# Patient Record
Sex: Female | Born: 1995 | Race: Black or African American | Hispanic: No | Marital: Single | Smoking: Never smoker
Health system: Southern US, Community
[De-identification: ages and names within clinical notes are randomized; demographics above are authoritative.]

## PROBLEM LIST (undated history)

## (undated) DIAGNOSIS — N809 Endometriosis, unspecified: Secondary | ICD-10-CM

## (undated) DIAGNOSIS — O139 Gestational [pregnancy-induced] hypertension without significant proteinuria, unspecified trimester: Secondary | ICD-10-CM

## (undated) HISTORY — DX: Gestational (pregnancy-induced) hypertension without significant proteinuria, unspecified trimester: O13.9

## (undated) HISTORY — PX: WISDOM TOOTH EXTRACTION: SHX21

---

## 2017-05-25 ENCOUNTER — Emergency Department (HOSPITAL_COMMUNITY): Payer: Self-pay

## 2017-05-25 ENCOUNTER — Emergency Department (HOSPITAL_COMMUNITY)
Admission: EM | Admit: 2017-05-25 | Discharge: 2017-05-26 | Discharge status: Against Medical Advice | Payer: Self-pay | Attending: Emergency Medicine | Admitting: Emergency Medicine

## 2017-05-25 ENCOUNTER — Encounter (HOSPITAL_COMMUNITY): Payer: Self-pay | Admitting: Emergency Medicine

## 2017-05-25 DIAGNOSIS — R079 Chest pain, unspecified: Secondary | ICD-10-CM

## 2017-05-25 LAB — I-STAT BETA HCG BLOOD, ED (MC, WL, AP ONLY): I-stat hCG, quantitative: <5 m[IU]/mL (ref <5)

## 2017-05-25 LAB — CBC
HEMATOCRIT: 33.3 % — AB (ref 36.0–46.0)
HEMOGLOBIN: 11.3 g/dL — AB (ref 12.0–15.0)
MCH: 26.3 pg (ref 26.0–34.0)
MCHC: 33.9 g/dL (ref 30.0–36.0)
MCV: 77.4 fL — ABNORMAL LOW (ref 78.0–100.0)
Platelets: 212 10*3/uL (ref 150–400)
RBC: 4.3 MIL/uL (ref 3.87–5.11)
RDW: 13.9 % (ref 11.5–15.5)
WBC: 5.6 10*3/uL (ref 4.0–10.5)

## 2017-05-25 LAB — BASIC METABOLIC PANEL
ANION GAP: 8 (ref 5–15)
BUN: 12 mg/dL (ref 6–20)
CALCIUM: 8.9 mg/dL (ref 8.9–10.3)
CHLORIDE: 107 mmol/L (ref 101–111)
CO2: 23 mmol/L (ref 22–32)
Creatinine, Ser: 0.8 mg/dL (ref 0.44–1.00)
GFR calc non Af Amer: >60 mL/min (ref >60)
GLUCOSE: 82 mg/dL (ref 65–99)
POTASSIUM: 3.6 mmol/L (ref 3.5–5.1)
Sodium: 138 mmol/L (ref 135–145)

## 2017-05-25 LAB — I-STAT TROPONIN, ED: TROPONIN I, POC: 0 ng/mL (ref 0.00–0.08)
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
## 2017-05-26 LAB — D-DIMER, QUANTITATIVE: D-Dimer, Quant: <0.27 ug/mL-FEU (ref 0.00–0.50)

## 2017-05-26 NOTE — ED Notes (Signed)
Called main lab to add on ddimer. 

## 2017-05-26 NOTE — ED Notes (Signed)
Pt reporting she wants to leave; ambulatory with steady gait to lobby with friend

## 2017-05-26 NOTE — ED Notes (Signed)
This RN saw pt leaving, EDP aware.
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
## 2019-05-24 LAB — OB RESULTS CONSOLE HIV ANTIBODY (ROUTINE TESTING): HIV: NONREACTIVE

## 2019-05-24 LAB — OB RESULTS CONSOLE HEPATITIS B SURFACE ANTIGEN: Hepatitis B Surface Ag: NEGATIVE

## 2019-05-24 LAB — OB RESULTS CONSOLE RUBELLA ANTIBODY, IGM: Rubella: IMMUNE

## 2019-06-17 LAB — OB RESULTS CONSOLE GC/CHLAMYDIA
Chlamydia: NEGATIVE
Gonorrhea: NEGATIVE

## 2019-10-03 LAB — OB RESULTS CONSOLE HIV ANTIBODY (ROUTINE TESTING): HIV: NONREACTIVE

## 2019-12-23 ENCOUNTER — Telehealth (HOSPITAL_COMMUNITY): Payer: Self-pay | Admitting: *Deleted

## 2019-12-23 ENCOUNTER — Encounter (HOSPITAL_COMMUNITY): Payer: Self-pay | Admitting: *Deleted

## 2019-12-23 NOTE — Telephone Encounter (Signed)
Preadmission screen  

## 2019-12-24 ENCOUNTER — Other Ambulatory Visit (HOSPITAL_COMMUNITY)
Admission: RE | Admit: 2019-12-24 | Discharge: 2019-12-24 | Disposition: A | Discharge status: Home / Self Care | Payer: BC Managed Care – PPO | Source: Ambulatory Visit | Attending: Obstetrics and Gynecology | Admitting: Obstetrics and Gynecology

## 2019-12-24 DIAGNOSIS — Z20822 Contact with and (suspected) exposure to covid-19: Secondary | ICD-10-CM | POA: Insufficient documentation

## 2019-12-24 DIAGNOSIS — Z01812 Encounter for preprocedural laboratory examination: Secondary | ICD-10-CM | POA: Insufficient documentation

## 2019-12-24 LAB — SARS CORONAVIRUS 2 (TAT 6-24 HRS): SARS Coronavirus 2: NEGATIVE

## 2019-12-24 NOTE — H&P (Signed)
Ruth Mann is a 24 y.o. female presenting for IOL s/s gHTN w/out severe features. BP elevated in the office. Labs drawn and pending.  Hx depression and anxiety not requiring meds. 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   OB History    Gravida  1   Para      Term      Preterm      AB      Living        SAB      TAB      Ectopic      Multiple      Live Births             Past Medical History:  Diagnosis Date  . Pregnancy induced hypertension    Past Surgical History:  Procedure Laterality Date  . WISDOM TOOTH EXTRACTION     Family History: family history is not on file. Social History:  reports that she has never smoked. She has never used smokeless tobacco. She reports current alcohol use. She reports that she does not use drugs.     Maternal Diabetes: No Genetic Screening: Normal Maternal Ultrasounds/Referrals: Normal Fetal Ultrasounds or other Referrals:  None Maternal Substance Abuse:  No Significant Maternal Medications:  None Significant Maternal Lab Results:  None Other Comments:  None  Review of Systems History   There were no vitals taken for this visit. Exam Physical Exam  (from office) NAD, A&O NWOB Abd soft, nondistended, gravid SVE closed/long/thick  Prenatal labs: ABO, Rh:  O+/Ab neg Antibody:   Rubella:   RPR:    HBsAg:    HIV:    GBS:   unknown  Assessment/Plan: 24 yo G1P0 @ 39.6 wga presenting for IOL s/s gHTN w/out severe features. Plan labs on admission. Cervix unfavorable. Plan for cytotec followed by pitocin/AROM when more favorable.  GBS unknown - had 4 week gap in [facility] before seeing [place] and swab not resulted yet. Will treat empirically as positive.     Ranae Pila 12/24/2019, 12:56 PM

## 2019-12-25 ENCOUNTER — Inpatient Hospital Stay (HOSPITAL_COMMUNITY): Payer: BC Managed Care – PPO | Admitting: Anesthesiology

## 2019-12-25 ENCOUNTER — Other Ambulatory Visit: Payer: Self-pay

## 2019-12-25 ENCOUNTER — Encounter (HOSPITAL_COMMUNITY): Payer: Self-pay | Admitting: Obstetrics and Gynecology

## 2019-12-25 ENCOUNTER — Inpatient Hospital Stay (HOSPITAL_COMMUNITY): Payer: BC Managed Care – PPO

## 2019-12-25 ENCOUNTER — Inpatient Hospital Stay (HOSPITAL_COMMUNITY)
Admission: AD | Admit: 2019-12-25 | Discharge: 2019-12-27 | DRG: 788 | Disposition: A | Discharge status: Home / Self Care | Payer: BC Managed Care – PPO | Attending: Obstetrics and Gynecology | Admitting: Obstetrics and Gynecology

## 2019-12-25 ENCOUNTER — Encounter (HOSPITAL_COMMUNITY)
Admission: AD | Disposition: A | Discharge status: Home / Self Care | Payer: Self-pay | Source: Home / Self Care | Attending: Obstetrics and Gynecology

## 2019-12-25 DIAGNOSIS — O9902 Anemia complicating childbirth: Secondary | ICD-10-CM | POA: Diagnosis present

## 2019-12-25 DIAGNOSIS — D649 Anemia, unspecified: Secondary | ICD-10-CM | POA: Diagnosis present

## 2019-12-25 DIAGNOSIS — Z20822 Contact with and (suspected) exposure to covid-19: Secondary | ICD-10-CM | POA: Diagnosis present

## 2019-12-25 DIAGNOSIS — Z3A39 39 weeks gestation of pregnancy: Secondary | ICD-10-CM

## 2019-12-25 DIAGNOSIS — O134 Gestational [pregnancy-induced] hypertension without significant proteinuria, complicating childbirth: Secondary | ICD-10-CM | POA: Diagnosis present

## 2019-12-25 DIAGNOSIS — Z3490 Encounter for supervision of normal pregnancy, unspecified, unspecified trimester: Secondary | ICD-10-CM

## 2019-12-25 LAB — CBC
HCT: 30.9 % — ABNORMAL LOW (ref 36.0–46.0)
HCT: 33.2 % — ABNORMAL LOW (ref 36.0–46.0)
HCT: 31.5 % — ABNORMAL LOW (ref 36.0–46.0)
Hemoglobin: 10.7 g/dL — ABNORMAL LOW (ref 12.0–15.0)
Hemoglobin: 11.5 g/dL — ABNORMAL LOW (ref 12.0–15.0)
Hemoglobin: 10.9 g/dL — ABNORMAL LOW (ref 12.0–15.0)
MCH: 28.3 pg (ref 26.0–34.0)
MCH: 27.6 pg (ref 26.0–34.0)
MCH: 28.3 pg (ref 26.0–34.0)
MCHC: 34.6 g/dL (ref 30.0–36.0)
MCHC: 34.6 g/dL (ref 30.0–36.0)
MCHC: 34.6 g/dL (ref 30.0–36.0)
MCV: 81.7 fL (ref 80.0–100.0)
MCV: 79.8 fL — ABNORMAL LOW (ref 80.0–100.0)
MCV: 81.8 fL (ref 80.0–100.0)
Platelets: 220 10*3/uL (ref 150–400)
Platelets: 240 10*3/uL (ref 150–400)
Platelets: 236 10*3/uL (ref 150–400)
RBC: 3.78 MIL/uL — ABNORMAL LOW (ref 3.87–5.11)
RBC: 4.16 MIL/uL (ref 3.87–5.11)
RBC: 3.85 MIL/uL — ABNORMAL LOW (ref 3.87–5.11)
RDW: 13.4 % (ref 11.5–15.5)
RDW: 13.3 % (ref 11.5–15.5)
RDW: 13.6 % (ref 11.5–15.5)
WBC: 10.3 10*3/uL (ref 4.0–10.5)
WBC: 9.4 10*3/uL (ref 4.0–10.5)
WBC: 16.9 10*3/uL — ABNORMAL HIGH (ref 4.0–10.5)
nRBC: 0 % (ref 0.0–0.2)
nRBC: 0 % (ref 0.0–0.2)
nRBC: 0 % (ref 0.0–0.2)

## 2019-12-25 LAB — COMPREHENSIVE METABOLIC PANEL
ALT: 13 U/L (ref 0–44)
AST: 18 U/L (ref 15–41)
Albumin: 2.6 g/dL — ABNORMAL LOW (ref 3.5–5.0)
Alkaline Phosphatase: 122 U/L (ref 38–126)
Anion gap: 9 (ref 5–15)
BUN: 5 mg/dL — ABNORMAL LOW (ref 6–20)
CO2: 23 mmol/L (ref 22–32)
Calcium: 9.5 mg/dL (ref 8.9–10.3)
Chloride: 105 mmol/L (ref 98–111)
Creatinine, Ser: 0.56 mg/dL (ref 0.44–1.00)
GFR calc Af Amer: >60 mL/min (ref >60)
GFR calc non Af Amer: >60 mL/min (ref >60)
Glucose, Bld: 91 mg/dL (ref 70–99)
Potassium: 3.7 mmol/L (ref 3.5–5.1)
Sodium: 137 mmol/L (ref 135–145)
Total Bilirubin: 0.5 mg/dL (ref 0.3–1.2)
Total Protein: 6.3 g/dL — ABNORMAL LOW (ref 6.5–8.1)

## 2019-12-25 LAB — TYPE AND SCREEN
ABO/RH(D): O POS
Antibody Screen: NEGATIVE

## 2019-12-25 LAB — RPR: RPR Ser Ql: NONREACTIVE

## 2019-12-25 LAB — ABO/RH: ABO/RH(D): O POS

## 2019-12-25 SURGERY — Surgical Case
Anesthesia: Epidural | Wound class: Clean Contaminated

## 2019-12-25 MED ORDER — SENNOSIDES-DOCUSATE SODIUM 8.6-50 MG PO TABS
2.0000 | ORAL_TABLET | ORAL | Status: DC
  Administered 2019-12-25 – 2019-12-26 (×2): 2 via ORAL
  Filled 2019-12-25 (×2): qty 2

## 2019-12-25 MED ORDER — FENTANYL CITRATE (PF) 100 MCG/2ML IJ SOLN
INTRAMUSCULAR | Status: DC | PRN
  Administered 2019-12-25: 100 ug via EPIDURAL

## 2019-12-25 MED ORDER — SOD CITRATE-CITRIC ACID 500-334 MG/5ML PO SOLN: 30.0000 mL | ORAL | Status: DC

## 2019-12-25 MED ORDER — LIDOCAINE-EPINEPHRINE (PF) 2 %-1:200000 IJ SOLN
INTRAMUSCULAR | Status: DC | PRN
  Administered 2019-12-25: 10 mL via EPIDURAL

## 2019-12-25 MED ORDER — MORPHINE SULFATE (PF) 0.5 MG/ML IJ SOLN
INTRAMUSCULAR | Status: DC | PRN
  Administered 2019-12-25: 3 mg via EPIDURAL

## 2019-12-25 MED ORDER — OXYCODONE HCL 5 MG PO TABS: 5.0000 mg | ORAL_TABLET | ORAL | Status: DC | PRN

## 2019-12-25 MED ORDER — LACTATED RINGERS IV SOLN: 500.0000 mL | Freq: Once | INTRAVENOUS | Status: DC

## 2019-12-25 MED ORDER — ZOLPIDEM TARTRATE 5 MG PO TABS: 5.0000 mg | ORAL_TABLET | Freq: Every evening | ORAL | Status: DC | PRN

## 2019-12-25 MED ORDER — SIMETHICONE 80 MG PO CHEW
80.0000 mg | CHEWABLE_TABLET | ORAL | Status: DC
  Administered 2019-12-25 – 2019-12-26 (×2): 80 mg via ORAL
  Filled 2019-12-25 (×2): qty 1

## 2019-12-25 MED ORDER — TERBUTALINE SULFATE 1 MG/ML IJ SOLN
0.2500 mg | Freq: Once | INTRAMUSCULAR | Status: AC | PRN
  Administered 2019-12-25: 0.25 mg via SUBCUTANEOUS
  Filled 2019-12-25: qty 1

## 2019-12-25 MED ORDER — DIBUCAINE (PERIANAL) 1 % EX OINT: 1.0000 "application " | TOPICAL_OINTMENT | CUTANEOUS | Status: DC | PRN

## 2019-12-25 MED ORDER — HYDROXYZINE HCL 50 MG PO TABS: 50.0000 mg | ORAL_TABLET | Freq: Four times a day (QID) | ORAL | Status: DC | PRN

## 2019-12-25 MED ORDER — SODIUM CHLORIDE 0.9 % IR SOLN
Status: DC | PRN
  Administered 2019-12-25: 1 via INTRAVESICAL

## 2019-12-25 MED ORDER — OXYCODONE-ACETAMINOPHEN 5-325 MG PO TABS: 1.0000 | ORAL_TABLET | ORAL | Status: DC | PRN

## 2019-12-25 MED ORDER — ACETAMINOPHEN 500 MG PO TABS: 1000.0000 mg | ORAL_TABLET | Freq: Four times a day (QID) | ORAL | Status: DC

## 2019-12-25 MED ORDER — OXYTOCIN 40 UNITS IN NORMAL SALINE INFUSION - SIMPLE MED: 2.5000 [IU]/h | INTRAVENOUS | Status: AC

## 2019-12-25 MED ORDER — PROMETHAZINE HCL 25 MG/ML IJ SOLN: 6.2500 mg | INTRAMUSCULAR | Status: DC | PRN

## 2019-12-25 MED ORDER — SIMETHICONE 80 MG PO CHEW: 80.0000 mg | CHEWABLE_TABLET | ORAL | Status: DC | PRN

## 2019-12-25 MED ORDER — SODIUM CHLORIDE 0.9 % IV SOLN
5.0000 10*6.[IU] | Freq: Once | INTRAVENOUS | Status: AC
  Administered 2019-12-25: 5 10*6.[IU] via INTRAVENOUS
  Filled 2019-12-25: qty 5

## 2019-12-25 MED ORDER — KETOROLAC TROMETHAMINE 30 MG/ML IJ SOLN
30.0000 mg | Freq: Once | INTRAMUSCULAR | Status: AC | PRN
  Administered 2019-12-25: 30 mg via INTRAVENOUS

## 2019-12-25 MED ORDER — DIPHENHYDRAMINE HCL 50 MG/ML IJ SOLN
INTRAMUSCULAR | Status: DC | PRN
  Administered 2019-12-25: 12.5 mg via INTRAVENOUS

## 2019-12-25 MED ORDER — STERILE WATER FOR IRRIGATION IR SOLN
Status: DC | PRN
  Administered 2019-12-25: 1

## 2019-12-25 MED ORDER — DIPHENHYDRAMINE HCL 25 MG PO CAPS
25.0000 mg | ORAL_CAPSULE | Freq: Four times a day (QID) | ORAL | Status: DC | PRN
  Administered 2019-12-26: 25 mg via ORAL

## 2019-12-25 MED ORDER — DIPHENHYDRAMINE HCL 25 MG PO CAPS
25.0000 mg | ORAL_CAPSULE | ORAL | Status: DC | PRN
  Filled 2019-12-25: qty 1

## 2019-12-25 MED ORDER — MEPERIDINE HCL 25 MG/ML IJ SOLN: 6.2500 mg | INTRAMUSCULAR | Status: DC | PRN

## 2019-12-25 MED ORDER — MISOPROSTOL 25 MCG QUARTER TABLET
25.0000 ug | ORAL_TABLET | ORAL | Status: DC | PRN
  Administered 2019-12-25 (×2): 25 ug via VAGINAL
  Filled 2019-12-25 (×2): qty 1

## 2019-12-25 MED ORDER — WITCH HAZEL-GLYCERIN EX PADS: 1.0000 "application " | MEDICATED_PAD | CUTANEOUS | Status: DC | PRN

## 2019-12-25 MED ORDER — ACETAMINOPHEN 500 MG PO TABS
1000.0000 mg | ORAL_TABLET | Freq: Four times a day (QID) | ORAL | Status: DC
  Administered 2019-12-26 – 2019-12-27 (×6): 1000 mg via ORAL
  Filled 2019-12-25 (×8): qty 2

## 2019-12-25 MED ORDER — KETOROLAC TROMETHAMINE 30 MG/ML IJ SOLN: 30.0000 mg | Freq: Four times a day (QID) | INTRAMUSCULAR | Status: AC | PRN

## 2019-12-25 MED ORDER — ONDANSETRON HCL 4 MG/2ML IJ SOLN
INTRAMUSCULAR | Status: AC
  Filled 2019-12-25: qty 2

## 2019-12-25 MED ORDER — SOD CITRATE-CITRIC ACID 500-334 MG/5ML PO SOLN
30.0000 mL | ORAL | Status: DC | PRN
  Administered 2019-12-25: 30 mL via ORAL
  Filled 2019-12-25: qty 30

## 2019-12-25 MED ORDER — HYDROMORPHONE HCL 1 MG/ML IJ SOLN: 0.2500 mg | INTRAMUSCULAR | Status: DC | PRN

## 2019-12-25 MED ORDER — NALOXONE HCL 4 MG/10ML IJ SOLN
1.0000 ug/kg/h | INTRAVENOUS | Status: DC | PRN
  Filled 2019-12-25: qty 5

## 2019-12-25 MED ORDER — KETOROLAC TROMETHAMINE 30 MG/ML IJ SOLN
INTRAMUSCULAR | Status: AC
  Filled 2019-12-25: qty 1

## 2019-12-25 MED ORDER — DEXAMETHASONE SODIUM PHOSPHATE 10 MG/ML IJ SOLN
INTRAMUSCULAR | Status: AC
  Filled 2019-12-25: qty 1

## 2019-12-25 MED ORDER — SODIUM CHLORIDE (PF) 0.9 % IJ SOLN
INTRAMUSCULAR | Status: DC | PRN
  Administered 2019-12-25: 12 mL/h via EPIDURAL

## 2019-12-25 MED ORDER — SCOPOLAMINE 1 MG/3DAYS TD PT72
MEDICATED_PATCH | TRANSDERMAL | Status: AC
  Filled 2019-12-25: qty 1

## 2019-12-25 MED ORDER — HYDROMORPHONE HCL 1 MG/ML IJ SOLN: 0.2000 mg | INTRAMUSCULAR | Status: DC | PRN

## 2019-12-25 MED ORDER — LACTATED RINGERS IV SOLN: INTRAVENOUS | Status: DC

## 2019-12-25 MED ORDER — LIDOCAINE 2% (20 MG/ML) 5 ML SYRINGE
INTRAMUSCULAR | Status: AC
  Filled 2019-12-25: qty 5

## 2019-12-25 MED ORDER — PHENYLEPHRINE 40 MCG/ML (10ML) SYRINGE FOR IV PUSH (FOR BLOOD PRESSURE SUPPORT)
80.0000 ug | PREFILLED_SYRINGE | INTRAVENOUS | Status: DC | PRN
  Administered 2019-12-25 (×2): 80 ug via INTRAVENOUS

## 2019-12-25 MED ORDER — OXYTOCIN 40 UNITS IN NORMAL SALINE INFUSION - SIMPLE MED
INTRAVENOUS | Status: DC | PRN
  Administered 2019-12-25: 500 mL via INTRAVENOUS

## 2019-12-25 MED ORDER — PHENYLEPHRINE 40 MCG/ML (10ML) SYRINGE FOR IV PUSH (FOR BLOOD PRESSURE SUPPORT)
80.0000 ug | PREFILLED_SYRINGE | INTRAVENOUS | Status: DC | PRN
  Filled 2019-12-25: qty 10

## 2019-12-25 MED ORDER — NALBUPHINE HCL 10 MG/ML IJ SOLN
5.0000 mg | INTRAMUSCULAR | Status: DC | PRN
  Administered 2019-12-26 (×2): 5 mg via INTRAVENOUS
  Filled 2019-12-25: qty 1

## 2019-12-25 MED ORDER — ONDANSETRON HCL 4 MG/2ML IJ SOLN
4.0000 mg | Freq: Three times a day (TID) | INTRAMUSCULAR | Status: DC | PRN
  Administered 2019-12-26: 4 mg via INTRAVENOUS
  Filled 2019-12-25: qty 2

## 2019-12-25 MED ORDER — ONDANSETRON HCL 4 MG/2ML IJ SOLN
4.0000 mg | Freq: Four times a day (QID) | INTRAMUSCULAR | Status: DC | PRN
  Administered 2019-12-25: 4 mg via INTRAVENOUS
  Filled 2019-12-25: qty 2

## 2019-12-25 MED ORDER — COCONUT OIL OIL: 1.0000 "application " | TOPICAL_OIL | Status: DC | PRN

## 2019-12-25 MED ORDER — MEPERIDINE HCL 25 MG/ML IJ SOLN
INTRAMUSCULAR | Status: DC | PRN
  Administered 2019-12-25: 12.5 mg via INTRAVENOUS

## 2019-12-25 MED ORDER — OXYTOCIN 40 UNITS IN NORMAL SALINE INFUSION - SIMPLE MED
INTRAVENOUS | Status: AC
  Filled 2019-12-25: qty 1000

## 2019-12-25 MED ORDER — NALBUPHINE HCL 10 MG/ML IJ SOLN
5.0000 mg | Freq: Once | INTRAMUSCULAR | Status: DC | PRN
  Filled 2019-12-25: qty 1

## 2019-12-25 MED ORDER — FENTANYL-BUPIVACAINE-NACL 0.5-0.125-0.9 MG/250ML-% EP SOLN
12.0000 mL/h | EPIDURAL | Status: DC | PRN
  Filled 2019-12-25: qty 250

## 2019-12-25 MED ORDER — SCOPOLAMINE 1 MG/3DAYS TD PT72
1.0000 | MEDICATED_PATCH | Freq: Once | TRANSDERMAL | Status: DC
  Administered 2019-12-25: 1.5 mg via TRANSDERMAL

## 2019-12-25 MED ORDER — LIDOCAINE HCL (PF) 1 % IJ SOLN
30.0000 mL | INTRAMUSCULAR | Status: AC | PRN
  Administered 2019-12-25 (×2): 5 mL via SUBCUTANEOUS

## 2019-12-25 MED ORDER — OXYCODONE-ACETAMINOPHEN 5-325 MG PO TABS: 2.0000 | ORAL_TABLET | ORAL | Status: DC | PRN

## 2019-12-25 MED ORDER — EPHEDRINE 5 MG/ML INJ: 10.0000 mg | INTRAVENOUS | Status: DC | PRN

## 2019-12-25 MED ORDER — ONDANSETRON HCL 4 MG/2ML IJ SOLN
INTRAMUSCULAR | Status: DC | PRN
  Administered 2019-12-25: 4 mg via INTRAVENOUS

## 2019-12-25 MED ORDER — NALBUPHINE HCL 10 MG/ML IJ SOLN: 5.0000 mg | Freq: Once | INTRAMUSCULAR | Status: DC | PRN

## 2019-12-25 MED ORDER — OXYTOCIN 40 UNITS IN NORMAL SALINE INFUSION - SIMPLE MED: 2.5000 [IU]/h | INTRAVENOUS | Status: DC

## 2019-12-25 MED ORDER — IBUPROFEN 800 MG PO TABS
800.0000 mg | ORAL_TABLET | Freq: Four times a day (QID) | ORAL | Status: DC
  Administered 2019-12-26 – 2019-12-27 (×3): 800 mg via ORAL
  Filled 2019-12-25 (×3): qty 1

## 2019-12-25 MED ORDER — SODIUM CHLORIDE 0.9% FLUSH: 3.0000 mL | INTRAVENOUS | Status: DC | PRN

## 2019-12-25 MED ORDER — DIPHENHYDRAMINE HCL 50 MG/ML IJ SOLN: 12.5000 mg | INTRAMUSCULAR | Status: DC | PRN

## 2019-12-25 MED ORDER — LACTATED RINGERS IV SOLN: 500.0000 mL | INTRAVENOUS | Status: DC | PRN

## 2019-12-25 MED ORDER — NALBUPHINE HCL 10 MG/ML IJ SOLN: 5.0000 mg | INTRAMUSCULAR | Status: DC | PRN

## 2019-12-25 MED ORDER — FENTANYL CITRATE (PF) 100 MCG/2ML IJ SOLN
INTRAMUSCULAR | Status: AC
  Filled 2019-12-25: qty 2

## 2019-12-25 MED ORDER — MENTHOL 3 MG MT LOZG: 1.0000 | LOZENGE | OROMUCOSAL | Status: DC | PRN

## 2019-12-25 MED ORDER — PENICILLIN G POT IN DEXTROSE 60000 UNIT/ML IV SOLN
3.0000 10*6.[IU] | INTRAVENOUS | Status: DC
  Administered 2019-12-25 (×3): 3 10*6.[IU] via INTRAVENOUS
  Filled 2019-12-25 (×3): qty 50

## 2019-12-25 MED ORDER — KETOROLAC TROMETHAMINE 30 MG/ML IJ SOLN
30.0000 mg | Freq: Four times a day (QID) | INTRAMUSCULAR | Status: AC
  Administered 2019-12-26 (×3): 30 mg via INTRAVENOUS
  Filled 2019-12-25 (×3): qty 1

## 2019-12-25 MED ORDER — DIPHENHYDRAMINE HCL 50 MG/ML IJ SOLN
INTRAMUSCULAR | Status: AC
  Filled 2019-12-25: qty 1

## 2019-12-25 MED ORDER — CEFAZOLIN SODIUM-DEXTROSE 2-4 GM/100ML-% IV SOLN
2.0000 g | INTRAVENOUS | Status: AC
  Administered 2019-12-25: 2 g via INTRAVENOUS

## 2019-12-25 MED ORDER — OXYTOCIN BOLUS FROM INFUSION: 500.0000 mL | Freq: Once | INTRAVENOUS | Status: DC

## 2019-12-25 MED ORDER — TETANUS-DIPHTH-ACELL PERTUSSIS 5-2.5-18.5 LF-MCG/0.5 IM SUSP: 0.5000 mL | Freq: Once | INTRAMUSCULAR | Status: DC

## 2019-12-25 MED ORDER — ACETAMINOPHEN 325 MG PO TABS: 650.0000 mg | ORAL_TABLET | ORAL | Status: DC | PRN

## 2019-12-25 MED ORDER — NALOXONE HCL 0.4 MG/ML IJ SOLN: 0.4000 mg | INTRAMUSCULAR | Status: DC | PRN

## 2019-12-25 MED ORDER — OXYTOCIN 40 UNITS IN NORMAL SALINE INFUSION - SIMPLE MED
1.0000 m[IU]/min | INTRAVENOUS | Status: DC
  Administered 2019-12-25: 2 m[IU]/min via INTRAVENOUS
  Filled 2019-12-25: qty 1000

## 2019-12-25 MED ORDER — SIMETHICONE 80 MG PO CHEW
80.0000 mg | CHEWABLE_TABLET | Freq: Three times a day (TID) | ORAL | Status: DC
  Administered 2019-12-26 – 2019-12-27 (×4): 80 mg via ORAL
  Filled 2019-12-25 (×4): qty 1

## 2019-12-25 MED ORDER — MORPHINE SULFATE (PF) 0.5 MG/ML IJ SOLN
INTRAMUSCULAR | Status: AC
  Filled 2019-12-25: qty 10

## 2019-12-25 MED ORDER — CEFAZOLIN SODIUM-DEXTROSE 2-4 GM/100ML-% IV SOLN
INTRAVENOUS | Status: AC
  Filled 2019-12-25: qty 100

## 2019-12-25 MED ORDER — BUTORPHANOL TARTRATE 1 MG/ML IJ SOLN
1.0000 mg | INTRAMUSCULAR | Status: DC | PRN
  Administered 2019-12-25: 1 mg via INTRAVENOUS
  Filled 2019-12-25: qty 1

## 2019-12-25 MED ORDER — PRENATAL MULTIVITAMIN CH
1.0000 | ORAL_TABLET | Freq: Every day | ORAL | Status: DC
  Administered 2019-12-26 – 2019-12-27 (×2): 1 via ORAL
  Filled 2019-12-25 (×2): qty 1

## 2019-12-25 MED ORDER — MEPERIDINE HCL 25 MG/ML IJ SOLN
INTRAMUSCULAR | Status: AC
  Filled 2019-12-25: qty 1

## 2019-12-25 SURGICAL SUPPLY — 33 items
CHLORAPREP W/TINT 26ML (MISCELLANEOUS) ×3 IMPLANT
CLAMP CORD UMBIL (MISCELLANEOUS) IMPLANT
CLOTH BEACON ORANGE TIMEOUT ST (SAFETY) ×3 IMPLANT
DERMABOND ADVANCED (GAUZE/BANDAGES/DRESSINGS) ×2
DERMABOND ADVANCED .7 DNX12 (GAUZE/BANDAGES/DRESSINGS) ×1 IMPLANT
DRSG OPSITE POSTOP 4X10 (GAUZE/BANDAGES/DRESSINGS) ×3 IMPLANT
ELECT REM PT RETURN 9FT ADLT (ELECTROSURGICAL) ×3
ELECTRODE REM PT RTRN 9FT ADLT (ELECTROSURGICAL) ×1 IMPLANT
EXTRACTOR VACUUM KIWI (MISCELLANEOUS) IMPLANT
GLOVE BIO SURGEON STRL SZ 6.5 (GLOVE) ×2 IMPLANT
GLOVE BIO SURGEONS STRL SZ 6.5 (GLOVE) ×1
GLOVE BIOGEL PI IND STRL 6.5 (GLOVE) ×1 IMPLANT
GLOVE BIOGEL PI IND STRL 7.0 (GLOVE) ×2 IMPLANT
GLOVE BIOGEL PI INDICATOR 6.5 (GLOVE) ×2
GLOVE BIOGEL PI INDICATOR 7.0 (GLOVE) ×4
GOWN STRL REUS W/TWL LRG LVL3 (GOWN DISPOSABLE) ×6 IMPLANT
KIT ABG SYR 3ML LUER SLIP (SYRINGE) ×3 IMPLANT
NDL HYPO 25X5/8 SAFETYGLIDE (NEEDLE) ×1 IMPLANT
NEEDLE HYPO 25X5/8 SAFETYGLIDE (NEEDLE) ×3 IMPLANT
NS IRRIG 1000ML POUR BTL (IV SOLUTION) ×3 IMPLANT
PACK C SECTION WH (CUSTOM PROCEDURE TRAY) ×3 IMPLANT
PAD OB MATERNITY 4.3X12.25 (PERSONAL CARE ITEMS) ×3 IMPLANT
PENCIL SMOKE EVAC W/HOLSTER (ELECTROSURGICAL) ×3 IMPLANT
SUT PLAIN 0 NONE (SUTURE) IMPLANT
SUT PLAIN 2 0 (SUTURE) ×2
SUT PLAIN ABS 2-0 CT1 27XMFL (SUTURE) ×1 IMPLANT
SUT VIC AB 0 CT1 36 (SUTURE) ×3 IMPLANT
SUT VIC AB 0 CTX 36 (SUTURE) ×6
SUT VIC AB 0 CTX36XBRD ANBCTRL (SUTURE) ×2 IMPLANT
SUT VIC AB 4-0 PS2 27 (SUTURE) ×3 IMPLANT
TOWEL OR 17X24 6PK STRL BLUE (TOWEL DISPOSABLE) ×3 IMPLANT
TRAY FOLEY W/BAG SLVR 14FR LF (SET/KITS/TRAYS/PACK) IMPLANT
WATER STERILE IRR 1000ML POUR (IV SOLUTION) ×3 IMPLANT

## 2019-12-25 NOTE — Op Note (Signed)
PROCEDURE DATE: 12/25/19  PREOPERATIVE DIAGNOSIS: Intrauterine pregnancy at 38.6 wga, Indication: nrFHT  POSTOPERATIVE DIAGNOSIS:The same  PROCEDURE: Primary Low TransverseCesarean Section  SURGEON: Dr. Belva Agee  INDICATIONS:This is a 24yo G1P0 at 13.6 wga requiring cesarean section secondary to nrFHT.  Patient was induced s/s PIH w/out severe features. Progressed to 3cm. Throughout majority of labor had variables and occasional lates at times responsive to position changes. Repetitive lates started and pitocin unable to be titrated.  Decision made to proceed with pLTCS.The risks of cesarean section discussed with the patient included but were not limited to: bleeding which may require transfusion or reoperation; infection which may require antibiotics; injury to bowel, bladder, ureters or other surrounding organs; injury to the fetus; need for additional procedures including hysterectomy in the event of a life-threatening hemorrhage; placental abnormalities wth subsequent pregnancies, incisional problems, thromboembolic phenomenon and other postoperative/anesthesia complications. The patient agreed with the proposed plan, giving informed consent for the procedure.   FINDINGS: Viable femaleinfant in vertex presentation,APGARspending, Weight pending, Amniotic fluid clear, Intact placenta, three vessel cord. Grossly normal uterus. .  ANESTHESIA: Epidural ESTIMATED BLOOD LOSS: 607cc SPECIMENS: Placenta for routine COMPLICATIONS: None immediate  PROCEDURE IN DETAIL: The patient received intravenous antibiotics (2g Ancef) and had sequential compression devices applied to her lower extremities while in the preoperative area. Shewasthen taken to the operating roomwhere epidural anesthesiawas dosed up to surgical level andwas found to be adequate. She was then placed in a dorsal supine position with a leftward tilt,and prepped and draped in a sterile manner.A foley  catheter was placed into her bladder and attached to constant gravity. After an adequate timeout was performed, aPfannenstiel skin incision was made with scalpel and carried through to the underlying layer of fascia. The fascia was incised in the midline and this incision was extended bilaterally using the Mayo scissors. Kocher clamps were applied to the superior aspect of the fascial incision and the underlying rectus muscles were dissected off bluntly. A similar process was carried out on the inferior aspect of the facial incision. The rectus muscles were separated in the midline bluntly and the peritoneum was entered bluntly. A bladder flap was created sharply and developed bluntly.Atransverse hysterotomy was made with a scalpel and extended bilaterally bluntly. The bladder blade was then removed. The infant was successfully delivered, and cord was clamped and cut and infant was handed over to awaiting neonatology team. Uterine massage was then administered and the placenta delivered intact with three-vessel cord. Cord gases were taken. The uterus was cleared of clot and debris. The hysterotomy was closed with 0 vicryl.A second imbricating suture of 0-vicryl was used to reinforce the incision and aid in hemostasis. Arista placed. The fascia was closed with 0-Vicryl in a running fashion with good restoration of anatomy. The subcutaneus tissue was irrigated and was reapproximated using three interrupted plain gut stitches. The skin was closed with 4-0 Vicryl in a subcuticular fashion.  All surgical site and was hemostatic at end of procedure) without any further bleeding on exam.   It's a girl - "Praisa"!!   Pt tolerated the procedure well. All sponge/lap/needle counts were correct X 2. Pt taken to recovery room in stable condition.   Belva Agee MD

## 2019-12-25 NOTE — Lactation Note (Signed)
This note was copied from a baby's chart. Lactation Consultation Note  Patient Name: Ruth Mann [id number]'G Date: 12/25/2019 Reason for consult: Initial assessment;1st time breastfeeding P1, 4 hour ETI infant. Mom did not have any breastfeeding classes in pregnancy a lot of Breastfeeding teaching provided by [facility]. Mom 's current choice is breast and formula feeding.  Per mom, infant breastfeed for 5 minutes at one feeding ,  she has been offered formula because  she was unsure if she has breast milk to give infant. LC unable to assist with latch due to mom giving infant 20 mls of Similac advance with iron prior to [facility] entering the room. LC discussed infant's small tummy size and colostrum within first few days of life. Mom taught back hand expression and easily expressed 4 mls of colostrum that she will offer at next feeding after latching infant at breast.  Mom was given a hand pump and was expressing EBM in hand pump.  Mom will latch infant at breast for next feeding and will ask for latch assistance if needed. Mom will attempt to breastfeed infant , establishing her milk supply and wait on giving infant formula at this time.  Mom knows to breastfeed infant according to hunger cues, 8 to 12 times within 24 hours on demand, and not to exceed 3 hours without breastfeeding infant. Mom will do as much STS as possible. Reviewed Baby & Me book's Breastfeeding Basics.  Mom made aware of O/P services, breastfeeding support groups, community resources, and our phone # for post-discharge questions.     Maternal Data Formula Feeding for Exclusion: Yes Reason for exclusion: Mother's choice to formula and breast feed on admission Has patient been taught Hand Expression?: Yes Does the patient have breastfeeding experience prior to this delivery?: No  Feeding Feeding Type: Breast Fed Nipple Type: Slow - flow  LATCH Score                   Interventions Interventions: Breast  feeding basics reviewed;Assisted with latch;Skin to skin;Hand express;Hand pump  Lactation Tools Discussed/Used WIC Program: No Pump Review: Setup, frequency, and cleaning;Milk Storage Initiated by:: Danelle Earthly, IBCLC Date initiated:: 12/25/19   Consult Status Consult Status: Follow-up Date: 12/26/19 Follow-up type: In-patient    Danelle Earthly 12/25/2019, 10:59 PM

## 2019-12-25 NOTE — Anesthesia Procedure Notes (Signed)
Epidural Patient location during procedure: OB Start time: 12/25/2019 9:53 AM End time: 12/25/2019 9:55 AM  Staffing Anesthesiologist: Leilani Able, MD Performed: anesthesiologist   Preanesthetic Checklist Completed: patient identified, IV checked, site marked, risks and benefits discussed, surgical consent, monitors and equipment checked, pre-op evaluation and timeout performed  Epidural Patient position: sitting Prep: DuraPrep and site prepped and draped Patient monitoring: continuous pulse ox and blood pressure Approach: midline Location: L3-L4 Injection technique: LOR air  Needle:  Needle type: Tuohy  Needle gauge: 17 G Needle length: 9 cm and 9 Needle insertion depth: 7 cm Catheter type: closed end flexible Catheter size: 19 Gauge Catheter at skin depth: 12 cm Test dose: negative and Other  Assessment Events: blood not aspirated, injection not painful, no injection resistance, no paresthesia and negative IV test  Additional Notes Reason for block:procedure for pain

## 2019-12-25 NOTE — Anesthesia Postprocedure Evaluation (Signed)
Anesthesia Post Note  Patient: [name]  Procedure(s) Performed: CESAREAN SECTION (N/A )     Patient location during evaluation: PACU Anesthesia Type: Epidural Level of consciousness: awake and alert and oriented Pain management: pain level controlled Vital Signs Assessment: post-procedure vital signs reviewed and stable Respiratory status: spontaneous breathing, nonlabored ventilation and respiratory function stable Cardiovascular status: blood pressure returned to baseline Postop Assessment: no apparent nausea or vomiting and epidural receding Anesthetic complications: no    Last Vitals:  Vitals:   12/25/19 1853 12/25/19 1900  BP: 125/78 130/79  Pulse:  (!) 111  Resp:  20  Temp:  36.7 C  SpO2:  100%    Last Pain:  Vitals:   12/25/19 1915  TempSrc:   PainSc: 0-No pain   Pain Goal: Patients Stated Pain Goal: 4 (12/25/19 0020)              Epidural/Spinal Function Cutaneous sensation: Able to Wiggle Toes (12/25/19 1915), Patient able to flex knees: Yes (12/25/19 1915), Patient able to lift hips off bed: No (12/25/19 1915), Back pain beyond tenderness at insertion site: No (12/25/19 1915), Progressively worsening motor and/or sensory loss: No (12/25/19 1915), Bowel and/or bladder incontinence post epidural: No (12/25/19 1915)  Ruth Mann

## 2019-12-25 NOTE — Progress Notes (Signed)
Repetitive lates and variables despite position change, oxygen, and turning off pitocin. IUPC placed. Terbutaline given. FHT recovery noted with good varaibility thereafter. CTM closely.    Rosie Fate MD

## 2019-12-25 NOTE — Transfer of Care (Signed)
Immediate Anesthesia Transfer of Care Note  Patient: [name]  Procedure(s) Performed: CESAREAN SECTION (N/A )  Patient Location: PACU  Anesthesia Type:Epidural  Level of Consciousness: awake, alert  and oriented  Airway & Oxygen Therapy: Patient Spontanous Breathing  Post-op Assessment: Report given to RN and Post -op Vital signs reviewed and stable  Post vital signs: Reviewed and stable  Last Vitals:  Vitals Value Taken Time  BP 125/78 12/25/19 1853  Temp    Pulse 103 12/25/19 1854  Resp 23 12/25/19 1854  SpO2 99 % 12/25/19 1854  Vitals shown include unvalidated device data.  Last Pain:  Vitals:   12/25/19 1700  TempSrc:   PainSc: 0-No pain      Patients Stated Pain Goal: 4 (12/25/19 0020)  Complications: No apparent anesthesia complications

## 2019-12-25 NOTE — Progress Notes (Signed)
Patient did well but when pitocin turned back on , she again started to have repetitive lates and variables. Her position was changed multiple times and she was monitored very closely. She was rechecked and remained unchanged since her exam at ~8am. Due to inability to titrate pitocin in the setting of non-reassuring fetal heart tracing, I recommend cesarean section. Risks discussed including infection, bleeding, damage to surrounding structures, the need for additional procedures including hysterectomy, and the possibility of uterine rupture with neonatal morbidity/mortality, scarring, and abnormal placentation with subsequent pregnancies. Patient agrees to proceed. 2g ancef on call to OR.   Of note, PNL's reviewed from office. O+/Ab neg/HIV NR/RPR NR/Hep B NR/HC NR/RI   Rosie Fate MD

## 2019-12-25 NOTE — Progress Notes (Signed)
Late entry s/s patient care. Labs WNL Reports contractions s/p two doses of cytotec AROM for clear fluid - SVE 3/60/-2, vertex Start pitocin

## 2019-12-25 NOTE — Anesthesia Preprocedure Evaluation (Signed)
Anesthesia Evaluation  Patient identified by MRN, date of birth, ID band Patient awake    Reviewed: Allergy & Precautions, H&P , NPO status , Patient's Chart, lab work & pertinent test results  Airway Mallampati: I  TM Distance: >3 FB Neck ROM: full    Dental no notable dental hx. (+) Teeth Intact   Pulmonary neg pulmonary ROS,    Pulmonary exam normal breath sounds clear to auscultation       Cardiovascular hypertension, negative cardio ROS Normal cardiovascular exam Rhythm:regular Rate:Normal     Neuro/Psych negative neurological ROS  negative psych ROS   GI/Hepatic negative GI ROS, Neg liver ROS,   Endo/Other  negative endocrine ROS  Renal/GU negative Renal ROS     Musculoskeletal   Abdominal Normal abdominal exam  (+)   Peds  Hematology  (+) Blood dyscrasia, anemia ,   Anesthesia Other Findings   Reproductive/Obstetrics (+) Pregnancy                             Anesthesia Physical Anesthesia Plan  ASA: II  Anesthesia Plan: Epidural   Post-op Pain Management:    Induction:   PONV Risk Score and Plan:   Airway Management Planned:   Additional Equipment:   Intra-op Plan:   Post-operative Plan:   Informed Consent: I have reviewed the patients History and Physical, chart, labs and discussed the procedure including the risks, benefits and alternatives for the proposed anesthesia with the patient or authorized representative who has indicated his/her understanding and acceptance.       Plan Discussed with:   Anesthesia Plan Comments:         Anesthesia Quick Evaluation

## 2019-12-26 ENCOUNTER — Encounter: Payer: Self-pay | Admitting: *Deleted

## 2019-12-26 LAB — CBC
HCT: 27.5 % — ABNORMAL LOW (ref 36.0–46.0)
Hemoglobin: 9.6 g/dL — ABNORMAL LOW (ref 12.0–15.0)
MCH: 28.4 pg (ref 26.0–34.0)
MCHC: 34.9 g/dL (ref 30.0–36.0)
MCV: 81.4 fL (ref 80.0–100.0)
Platelets: 225 10*3/uL (ref 150–400)
RBC: 3.38 MIL/uL — ABNORMAL LOW (ref 3.87–5.11)
RDW: 13.4 % (ref 11.5–15.5)
WBC: 20.6 10*3/uL — ABNORMAL HIGH (ref 4.0–10.5)
nRBC: 0 % (ref 0.0–0.2)

## 2019-12-26 NOTE — Lactation Note (Signed)
This note was copied from a baby's chart. Lactation Consultation Note  Patient Name: Ruth Mann [id number] Date: 12/26/2019 Reason for consult: Follow-up assessment Baby is 15 hours old.  Mom called out for latch assist.  Baby is currently sleeping in mom's arms.  Waking techniques done.  Baby has a strong suck on gloved finger.  Baby positioned in football hold skin to skin.  Mom has erect short shafted nipples.  Baby latched well using tea cup hold but fell asleep after a few minutes.  This was repeated a few times.  Hand expression done but no colostrum expressed.  Reassured mom and discussed first 24 hour behavior.  Instructed to watch for cues and call for assist prn.  Maternal Data    Feeding Feeding Type: Breast Fed  LATCH Score Latch: Repeated attempts needed to sustain latch, nipple held in mouth throughout feeding, stimulation needed to elicit sucking reflex.  Audible Swallowing: A few with stimulation  Type of Nipple: Everted at rest and after stimulation  Comfort (Breast/Nipple): Soft / non-tender  Hold (Positioning): Assistance needed to correctly position infant at breast and maintain latch.  LATCH Score: 7  Interventions Interventions: Breast compression;Assisted with latch;Adjust position;Skin to skin;Support pillows;Breast massage;Hand express  Lactation Tools Discussed/Used     Consult Status Consult Status: Follow-up Date: 12/27/19 Follow-up type: In-patient    [name] 12/26/2019, 9:04 AM

## 2019-12-26 NOTE — Progress Notes (Signed)
Subjective: Postpartum Day 1: Cesarean Delivery Patient reports tolerating PO and no problems voiding.    Objective: Vital signs in last 24 hours: Temp:  [97.9 F (36.6 C)-98.6 F (37 C)] 98.6 F (37 C) (03/11 0850) Pulse Rate:  [53-111] 71 (03/11 0850) Resp:  [12-20] 18 (03/11 0850) BP: (107-150)/(58-95) 107/59 (03/11 0850) SpO2:  [98 %-100 %] 98 % (03/11 0850)  Physical Exam:  General: alert, cooperative, appears stated age and no distress Lochia: appropriate Uterine Fundus: firm Incision: healing well DVT Evaluation: No evidence of DVT seen on physical exam.  Recent Labs    12/25/19 1923 12/26/19 0532  HGB 10.9* 9.6*  HCT 31.5* 27.5*    Assessment/Plan: Status post Cesarean section. Doing well postoperatively.  Continue current care.  Turner Daniels 12/26/2019, 10:24 AM

## 2019-12-26 NOTE — Progress Notes (Signed)
Nursing Instructor and Nursing Student  entered [place] Depression Scale information for pt and score total was a 12. RN Ludger Nutting Efrid was notified and said that she would contact social work. Pt denies any active thoughts to harm self or neonate. Will continue to monitor.

## 2019-12-26 NOTE — Progress Notes (Signed)
CSW received consult for hx of Anxiety and Depression.  CSW met with MOB to offer support and complete assessment.    CSW congratulated MOB on the birth of infant. CSW advised MOB of CSW's role and the reason for cSW coming to visit with her. MOB reported that she does have a history of depression and anxiety with no diagnosis. MOB reports that's he has symptoms of depression and anxiety which include feelings of worrying. MOB reports that's he tends to worry about "everything" and that it can hard for her at times to not worry. MOB reported that she worried less in her second trimester and more during her third trimester. CSW inquired from MOB on if she has ever been in therapy of taking medications for her anxiety and MOB declined. MOB was open to Therapy resources as MOB reported that around 8-8 1/2 months she had thoughts of  wanting to harm herself. MOB declined doing this but does report that she spoke with the National Suicide Hotline "and they helped me a lot". CSW asked MOB what led her to feel SI. MOB reported that during that time she felt alone and felt that she had no one. CSW asked MOB what has changed for her since this incident? MOB reported that she feels support from her friends and boyfriend. MOB did report that during that incident she felt support from her boyfriend as " I called him and told him what was happening and he was very helpful in calming me down also. I think it may have just been a lot of hormones to" CSW inquired from MOB on if she was feeling SI or HI at this time and MOB reported that she has had "mixed emotions". MOB reported that she  feels excited but yet feels that she is becoming a perfectionist. MOB reported the desire to do things right for infant and to be the best mother that she can be. CSW offered MOB ways that she can feel confident in her ability to care for infant in which MOB reported were helpful for her. MOB expressed that she has no family close by and that  they all live in [place].   CSW took time to educate MOB on PPD and SIDS. CSW provided MOB with PPD Checklist and asked that MOB look over this weekly/daily as this may help with being aware of signs of PPD. CSW did voice importance to MOB to reach out to medical staff to provider in the  event that MOB becomes SI or HI once again. MOB reported that she still has the information for the [facility] and expressed that she would use it if needed. MOB reported no other needs currently and expressed feeling fine at this time.   CSW identifies no further need for intervention and no barriers to discharge at this time.   Annelie Boak S. Raya Mckinstry, MSW, LCSW [facility] [phone]  

## 2019-12-27 ENCOUNTER — Ambulatory Visit: Payer: Self-pay

## 2019-12-27 MED ORDER — IBUPROFEN 800 MG PO TABS: 800.0000 mg | ORAL_TABLET | Freq: Three times a day (TID) | ORAL | 0 refills | Status: AC | PRN

## 2019-12-27 MED ORDER — OXYCODONE HCL 5 MG PO TABS: 5.0000 mg | ORAL_TABLET | Freq: Four times a day (QID) | ORAL | 0 refills | Status: DC | PRN

## 2019-12-27 MED ORDER — ACETAMINOPHEN 500 MG PO TABS: 1000.0000 mg | ORAL_TABLET | Freq: Three times a day (TID) | ORAL | 0 refills | Status: DC | PRN

## 2019-12-27 NOTE — Discharge Summary (Signed)
Obstetric Discharge Summary Reason for Admission: induction of labor Prenatal Procedures: none Intrapartum Procedures: cesarean: low cervical, transverse Postpartum Procedures: none Complications-Operative and Postpartum: none Hemoglobin  Date Value Ref Range Status  12/26/2019 9.6 (L) 12.0 - 15.0 g/dL Final   HCT  Date Value Ref Range Status  12/26/2019 27.5 (L) 36.0 - 46.0 % Final    Physical Exam:  General: alert, cooperative and no distress Lochia: appropriate Uterine Fundus: firm Incision: healing well DVT Evaluation: No evidence of DVT seen on physical exam.  Discharge Diagnoses: Term Pregnancy-delivered  Discharge Information: Date: 12/27/2019 Activity: pelvic rest Diet: routine Medications: None, Ibuprofen and oxycodone Condition: stable Instructions: refer to practice specific booklet Discharge to: home   Newborn Data: Live born female  Birth Weight: 6 lb 8.8 oz (2971 g) APGAR: 8, 10  Newborn Delivery   Birth date/time: 12/25/2019 18:04:00 Delivery type: C-Section, Vacuum Assisted Trial of labor: Yes C-section categorization: Primary      Home with mother.  Roselle Locus II 12/27/2019, 7:19 AM

## 2019-12-27 NOTE — Lactation Note (Signed)
This note was copied from a baby's chart. Lactation Consultation Note  Patient Name: Ruth Mann RJJOA'C Date: 12/27/2019   P1, 30 hour female infant- weight loss -2%. LC entered room mom and infant asleep at this time.   Maternal Data    Feeding Feeding Type: Formula Nipple Type: Slow - flow  LATCH Score Latch: (instructed to call if she latches infant)                 Interventions    Lactation Tools Discussed/Used     Consult Status      Danelle Earthly 12/27/2019, 12:32 AM

## 2019-12-27 NOTE — Lactation Note (Signed)
This note was copied from a baby's chart. Lactation Consultation Note  Patient Name: Ruth Mann TKPTW'S Date: 12/27/2019 Reason for consult: Follow-up assessment;1st time breastfeeding;Primapara;Early term 37-38.6wks;Infant weight loss  Baby is 17 hours old  Baby awake and rooting,. LC assisted mom to latch on the right breast / football / with depth and baby fed for 8 mins and released.  Baby has had multiple bottles and LC suspects baby has gotten use the quick flow  And will do better with staying interested in staying latched longer when there I more  Flow.  LC recommended prior to latching on the 1st breast , breast massage , hand express,pre - pump with hand pump and latch. If to fussy to latch , give an appetizer prior to latch. Baby will probably be calmer.  LC also recommended to call her insurance company for her DEBP today and can add post pumping to enhance her milk coming in .  Mom has the [facility] pamphlet with [facility] phone number.  LC stressed the importance of baby having practice at the breast .    Maternal Data Has patient been taught Hand Expression?: Yes  Feeding Feeding Type: Breast Fed  LATCH Score Latch: Grasps breast easily, tongue down, lips flanged, rhythmical sucking.  Audible Swallowing: Spontaneous and intermittent  Type of Nipple: Everted at rest and after stimulation  Comfort (Breast/Nipple): Filling, red/small blisters or bruises, mild/mod discomfort  Hold (Positioning): Assistance needed to correctly position infant at breast and maintain latch.  LATCH Score: 8  Interventions Interventions: Breast feeding basics reviewed;Assisted with latch;Skin to skin;Breast massage;Hand express;Breast compression;Adjust position;Support pillows;Position options  Lactation Tools Discussed/Used Tools: Pump Breast pump type: Double-Electric Breast Pump;Manual Pump Review: Milk Storage   Consult Status Consult Status: Complete Date:  12/27/19    Kathrin Greathouse 12/27/2019, 1:16 PM

## 2019-12-28 ENCOUNTER — Encounter (HOSPITAL_COMMUNITY): Payer: Self-pay | Admitting: Obstetrics and Gynecology

## 2019-12-28 ENCOUNTER — Inpatient Hospital Stay (HOSPITAL_COMMUNITY)
Admission: AD | Admit: 2019-12-28 | Discharge: 2019-12-29 | Discharge status: Against Medical Advice | Payer: BC Managed Care – PPO | Source: Ambulatory Visit | Attending: Obstetrics and Gynecology | Admitting: Obstetrics and Gynecology

## 2019-12-28 ENCOUNTER — Other Ambulatory Visit: Payer: Self-pay

## 2019-12-28 DIAGNOSIS — O1205 Gestational edema, complicating the puerperium: Secondary | ICD-10-CM

## 2019-12-28 DIAGNOSIS — Z5321 Procedure and treatment not carried out due to patient leaving prior to being seen by health care provider: Secondary | ICD-10-CM | POA: Insufficient documentation

## 2019-12-28 NOTE — MAU Note (Signed)
Delivered on March 10th, had swelling in feet but was told it will improve within 2 weeks but worse in last 2 days and felt numbness in left foot.  Having incisional pain with activity or coughing and worse today,   took Ibuprofen at 5 pm and Tylenol at 5 pm- didn't take Oxycodone since being discharged.

## 2019-12-29 DIAGNOSIS — Z5321 Procedure and treatment not carried out due to patient leaving prior to being seen by health care provider: Secondary | ICD-10-CM | POA: Diagnosis not present

## 2019-12-29 DIAGNOSIS — O1205 Gestational edema, complicating the puerperium: Secondary | ICD-10-CM | POA: Diagnosis not present

## 2019-12-29 NOTE — MAU Note (Signed)
Ruth Mann NT said pt's door was open, went to check and pt was gone, her clothes were gone, pt not in bathroom.  Notified [name] that pt left AMA.

## 2019-12-29 NOTE — Progress Notes (Signed)
Pt left MAU AMA prior to being seen by provider.  VS, including BP were wnl.

## 2020-03-14 ENCOUNTER — Encounter (HOSPITAL_COMMUNITY): Payer: Self-pay | Admitting: *Deleted

## 2020-03-14 ENCOUNTER — Other Ambulatory Visit: Payer: Self-pay

## 2020-03-14 ENCOUNTER — Emergency Department (HOSPITAL_COMMUNITY)
Admission: EM | Admit: 2020-03-14 | Discharge: 2020-03-15 | Discharge status: Against Medical Advice | Payer: BC Managed Care – PPO | Attending: Emergency Medicine | Admitting: Emergency Medicine

## 2020-03-14 DIAGNOSIS — Z5321 Procedure and treatment not carried out due to patient leaving prior to being seen by health care provider: Secondary | ICD-10-CM | POA: Diagnosis not present

## 2020-03-14 DIAGNOSIS — R109 Unspecified abdominal pain: Secondary | ICD-10-CM | POA: Diagnosis present

## 2020-03-14 LAB — URINALYSIS, ROUTINE W REFLEX MICROSCOPIC
Bilirubin Urine: NEGATIVE
Glucose, UA: NEGATIVE mg/dL
Ketones, ur: 80 mg/dL — AB
Nitrite: NEGATIVE
Protein, ur: 30 mg/dL — AB
Specific Gravity, Urine: 1.023 (ref 1.005–1.030)
WBC, UA: >50 WBC/hpf — ABNORMAL HIGH (ref 0–5)
pH: 5 (ref 5.0–8.0)

## 2020-03-14 LAB — CBC
HCT: 35.6 % — ABNORMAL LOW (ref 36.0–46.0)
Hemoglobin: 12 g/dL (ref 12.0–15.0)
MCH: 25.9 pg — ABNORMAL LOW (ref 26.0–34.0)
MCHC: 33.7 g/dL (ref 30.0–36.0)
MCV: 76.7 fL — ABNORMAL LOW (ref 80.0–100.0)
Platelets: 469 10*3/uL — ABNORMAL HIGH (ref 150–400)
RBC: 4.64 MIL/uL (ref 3.87–5.11)
RDW: 14.7 % (ref 11.5–15.5)
WBC: 9.2 10*3/uL (ref 4.0–10.5)
nRBC: 0 % (ref 0.0–0.2)

## 2020-03-14 LAB — COMPREHENSIVE METABOLIC PANEL
ALT: 17 U/L (ref 0–44)
AST: 21 U/L (ref 15–41)
Albumin: 3.4 g/dL — ABNORMAL LOW (ref 3.5–5.0)
Alkaline Phosphatase: 73 U/L (ref 38–126)
Anion gap: 16 — ABNORMAL HIGH (ref 5–15)
BUN: 10 mg/dL (ref 6–20)
CO2: 21 mmol/L — ABNORMAL LOW (ref 22–32)
Calcium: 9.2 mg/dL (ref 8.9–10.3)
Chloride: 102 mmol/L (ref 98–111)
Creatinine, Ser: 0.78 mg/dL (ref 0.44–1.00)
GFR calc Af Amer: >60 mL/min (ref >60)
GFR calc non Af Amer: >60 mL/min (ref >60)
Glucose, Bld: 77 mg/dL (ref 70–99)
Potassium: 3.7 mmol/L (ref 3.5–5.1)
Sodium: 139 mmol/L (ref 135–145)
Total Bilirubin: 0.6 mg/dL (ref 0.3–1.2)
Total Protein: 8 g/dL (ref 6.5–8.1)

## 2020-03-14 LAB — I-STAT BETA HCG BLOOD, ED (MC, WL, AP ONLY): I-stat hCG, quantitative: <5 m[IU]/mL (ref <5)

## 2020-03-14 LAB — LIPASE, BLOOD: Lipase: 19 U/L (ref 11–51)

## 2020-03-14 MED ORDER — SODIUM CHLORIDE 0.9% FLUSH: 3.0000 mL | Freq: Once | INTRAVENOUS | Status: DC

## 2020-03-14 NOTE — ED Triage Notes (Signed)
The pt is c/o rt sided abd pain for 2 weeks  fecver 3 days ago none now.  Nausea no vomiting   lmp may 15

## 2020-03-15 NOTE — ED Notes (Signed)
Patient states she is leaving because it is taking too long

## 2021-06-01 ENCOUNTER — Other Ambulatory Visit (HOSPITAL_COMMUNITY): Payer: Self-pay | Admitting: *Deleted

## 2021-06-02 ENCOUNTER — Encounter (HOSPITAL_COMMUNITY): Payer: Self-pay

## 2021-06-02 ENCOUNTER — Inpatient Hospital Stay (HOSPITAL_COMMUNITY): Admission: RE | Admit: 2021-06-02 | Payer: BC Managed Care – PPO | Source: Ambulatory Visit

## 2021-06-07 ENCOUNTER — Encounter (HOSPITAL_COMMUNITY): Payer: Medicaid Other

## 2021-06-15 ENCOUNTER — Ambulatory Visit (HOSPITAL_COMMUNITY)
Admission: EM | Admit: 2021-06-15 | Discharge: 2021-06-15 | Disposition: A | Discharge status: Home / Self Care | Payer: BC Managed Care – PPO | Attending: Student | Admitting: Student

## 2021-06-15 ENCOUNTER — Other Ambulatory Visit: Payer: Self-pay

## 2021-06-15 DIAGNOSIS — O99345 Other mental disorders complicating the puerperium: Secondary | ICD-10-CM | POA: Diagnosis not present

## 2021-06-15 DIAGNOSIS — R454 Irritability and anger: Secondary | ICD-10-CM

## 2021-06-15 DIAGNOSIS — F53 Postpartum depression: Secondary | ICD-10-CM | POA: Diagnosis not present

## 2021-06-15 DIAGNOSIS — F410 Panic disorder [episodic paroxysmal anxiety] without agoraphobia: Secondary | ICD-10-CM | POA: Diagnosis not present

## 2021-06-15 MED ORDER — HYDROXYZINE HCL 10 MG PO TABS: 10.0000 mg | ORAL_TABLET | Freq: Three times a day (TID) | ORAL | 0 refills | Status: AC | PRN
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
## 2021-06-15 NOTE — Discharge Instructions (Addendum)
Outpatient treatment is recommended:  The following are options that accept your insurance  [facility] - Offers virtual and in person therapy and medication management [address] [place], [place] [address] Phone: [phone]  [facility] [address] [place], [place] [address] Phone: [phone]  [facility] [address] [place], [place] [address] Phone: [phone]  For additional options : Psychology Today is a helpful resource - PsychologyToday.com - You can view provider profiles on this site.
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
## 2021-06-24 ENCOUNTER — Inpatient Hospital Stay (HOSPITAL_COMMUNITY)
Admission: AD | Admit: 2021-06-24 | Discharge: 2021-06-26 | DRG: 759 | Disposition: A | Discharge status: Home / Self Care | Payer: Medicaid Other | Attending: Obstetrics and Gynecology | Admitting: Obstetrics and Gynecology

## 2021-06-24 ENCOUNTER — Encounter (HOSPITAL_COMMUNITY): Payer: Self-pay | Admitting: Obstetrics and Gynecology

## 2021-06-24 ENCOUNTER — Other Ambulatory Visit: Payer: Self-pay

## 2021-06-24 ENCOUNTER — Emergency Department (HOSPITAL_COMMUNITY)
Admission: EM | Admit: 2021-06-24 | Discharge: 2021-06-24 | Disposition: A | Discharge status: Home / Self Care | Payer: Medicaid Other

## 2021-06-24 DIAGNOSIS — D500 Iron deficiency anemia secondary to blood loss (chronic): Secondary | ICD-10-CM | POA: Diagnosis present

## 2021-06-24 DIAGNOSIS — N730 Acute parametritis and pelvic cellulitis: Principal | ICD-10-CM | POA: Diagnosis present

## 2021-06-24 DIAGNOSIS — Z20822 Contact with and (suspected) exposure to covid-19: Secondary | ICD-10-CM | POA: Diagnosis present

## 2021-06-24 DIAGNOSIS — N7093 Salpingitis and oophoritis, unspecified: Secondary | ICD-10-CM

## 2021-06-24 LAB — CBC WITH DIFFERENTIAL/PLATELET
Abs Immature Granulocytes: 0.01 10*3/uL (ref 0.00–0.07)
Basophils Absolute: 0 10*3/uL (ref 0.0–0.1)
Basophils Relative: 1 %
Eosinophils Absolute: 0 10*3/uL (ref 0.0–0.5)
Eosinophils Relative: 1 %
HCT: 28.7 % — ABNORMAL LOW (ref 36.0–46.0)
Hemoglobin: 9.5 g/dL — ABNORMAL LOW (ref 12.0–15.0)
Immature Granulocytes: 0 %
Lymphocytes Relative: 42 %
Lymphs Abs: 1.8 10*3/uL (ref 0.7–4.0)
MCH: 24.3 pg — ABNORMAL LOW (ref 26.0–34.0)
MCHC: 33.1 g/dL (ref 30.0–36.0)
MCV: 73.4 fL — ABNORMAL LOW (ref 80.0–100.0)
Monocytes Absolute: 0.6 10*3/uL (ref 0.1–1.0)
Monocytes Relative: 13 %
Neutro Abs: 1.8 10*3/uL (ref 1.7–7.7)
Neutrophils Relative %: 43 %
Platelets: 190 10*3/uL (ref 150–400)
RBC: 3.91 MIL/uL (ref 3.87–5.11)
RDW: 14.7 % (ref 11.5–15.5)
WBC: 4.3 10*3/uL (ref 4.0–10.5)
nRBC: 0 % (ref 0.0–0.2)

## 2021-06-24 LAB — SARS CORONAVIRUS 2 (TAT 6-24 HRS): SARS Coronavirus 2: NEGATIVE

## 2021-06-24 MED ORDER — DOCUSATE SODIUM 100 MG PO CAPS
100.0000 mg | ORAL_CAPSULE | Freq: Two times a day (BID) | ORAL | Status: DC
  Administered 2021-06-24 – 2021-06-26 (×4): 100 mg via ORAL
  Filled 2021-06-24 (×4): qty 1

## 2021-06-24 MED ORDER — IBUPROFEN 600 MG PO TABS: 600.0000 mg | ORAL_TABLET | Freq: Four times a day (QID) | ORAL | Status: DC | PRN

## 2021-06-24 MED ORDER — TRAMADOL HCL 50 MG PO TABS: 50.0000 mg | ORAL_TABLET | Freq: Four times a day (QID) | ORAL | Status: DC | PRN

## 2021-06-24 MED ORDER — GENTAMICIN SULFATE 40 MG/ML IJ SOLN
5.0000 mg/kg | INTRAVENOUS | Status: DC
  Administered 2021-06-24 – 2021-06-25 (×2): 290 mg via INTRAVENOUS
  Filled 2021-06-24 (×3): qty 7.25

## 2021-06-24 MED ORDER — ONDANSETRON HCL 4 MG/2ML IJ SOLN: 4.0000 mg | Freq: Four times a day (QID) | INTRAMUSCULAR | Status: DC | PRN

## 2021-06-24 MED ORDER — ACETAMINOPHEN 500 MG PO TABS
1000.0000 mg | ORAL_TABLET | Freq: Four times a day (QID) | ORAL | Status: DC
  Administered 2021-06-24 – 2021-06-26 (×6): 1000 mg via ORAL
  Filled 2021-06-24 (×7): qty 2

## 2021-06-24 MED ORDER — PRENATAL MULTIVITAMIN CH
1.0000 | ORAL_TABLET | Freq: Every day | ORAL | Status: DC
  Administered 2021-06-25: 1 via ORAL
  Filled 2021-06-24: qty 1

## 2021-06-24 MED ORDER — CLINDAMYCIN PHOSPHATE 900 MG/50ML IV SOLN
900.0000 mg | Freq: Three times a day (TID) | INTRAVENOUS | Status: DC
  Administered 2021-06-24 – 2021-06-26 (×6): 900 mg via INTRAVENOUS
  Filled 2021-06-24 (×6): qty 50

## 2021-06-24 MED ORDER — ZOLPIDEM TARTRATE 5 MG PO TABS: 5.0000 mg | ORAL_TABLET | Freq: Every evening | ORAL | Status: DC | PRN

## 2021-06-24 MED ORDER — ONDANSETRON HCL 4 MG PO TABS
4.0000 mg | ORAL_TABLET | Freq: Four times a day (QID) | ORAL | Status: DC | PRN
  Filled 2021-06-24: qty 1

## 2021-06-24 NOTE — H&P (Signed)
Ruth Mann is an 25 y.o. female. with presumed PID and pyosalpinx persistent through PO antibiotic course.  + CHlamydia 05/26/2021. [place] at that time with bilateral hydrosalpinx. Right 4.9x2cm and Left 5.7 x 1.6cm. Treated with rocephin and doxycyline. Had flagyl for BV as well.  Returned today with continued pelvic pain on and off. ALso reports occ nausea. No emesis. Today NO PAIN except during the [place]. However, US demonstrated small ovarian cyst (1.1cm) and bilateral hydrosalpinx slightly decreased in size but still persistent. Previously on sprintec for [facility].   Pertinent Gynecological History: Menses: flow is moderate Contraception: none Sexually transmitted diseases: recent diagnosis: Chlamydia Previous GYN Procedures:  none   Last pap: normal Date: 2020 OB History: G1, P1 - CS x 1 "Praisa"   Menstrual History: No LMP recorded.    Past Medical History:  Diagnosis Date   Pregnancy induced hypertension     Past Surgical History:  Procedure Laterality Date   CESAREAN SECTION N/A 12/25/2019   Procedure: CESAREAN SECTION;  Surgeon: Ranae Pila, MD;  Location: [facility] LD ORS;  Service: Obstetrics;  Laterality: N/A;   WISDOM TOOTH EXTRACTION      No family history on file.  Social History:  reports that she has never smoked. She has never used smokeless tobacco. She reports that she does not currently use alcohol. She reports that she does not use drugs.  Allergies:  Allergies  Allergen Reactions   Milk-Related Compounds Rash    Allergic to all dairy    No medications prior to admission.    Review of Systems  unknown if currently breastfeeding. Physical Exam Gen: well appearing, NAD CV: Reg rate Pulm: NWOB Abd: soft, nondistended, nontender, no masses GYN: uterus 7 week size, no CMT but mild tenderness b/l adnexa Ext: No edema b/l    No results found for this or any previous visit (from the past 24 hour(s)).  No results found.  Assessment/Plan: 25 yo  G1P1 presenting with persistent hydrosalpinx vs pyosalpinx suspected to have persistent PID unresponsive to outpatient PO antibiotics.  She is non-toxic appearing, AF, and overall in NAD. However, given persistence of presumed pyosalpinx, I recommended inpatient admission for IV antibiotic x 24- 48 hours.    # PID/pyosalpinx:  - IV gent/clinda - CBC - IR consultation prn. Given in FT, defer at this time.   # Anemia- IV Fe planned given known anemia  Ranae Pila 06/24/2021, 1:55 PM

## 2021-06-24 NOTE — Progress Notes (Signed)
Pharmacy Antibiotic Note  Ruth Mann is a 25 y.o. female admitted on 06/24/2021 with PID (persistent through po course).  Pharmacy has been consulted for Gentamicin dosing.  Plan: Gentamicin 5mg /kg (290mg ) IV q24h Will continue to follow.  Height: 5\' 7"  (170.2 cm) Weight: 57.2 kg (126 lb) IBW/kg (Calculated) : 61.6  Temp (24hrs), Avg:98.6 F (37 C), Min:98.5 F (36.9 C), Max:98.6 F (37 C)  No results for input(s): WBC, CREATININE, LATICACIDVEN, VANCOTROUGH, VANCOPEAK, VANCORANDOM, GENTTROUGH, GENTPEAK, GENTRANDOM, TOBRATROUGH, TOBRAPEAK, TOBRARND, AMIKACINPEAK, AMIKACINTROU, AMIKACIN in the last 168 hours.  CrCl cannot be calculated (Patient's most recent lab result is older than the maximum 21 days allowed.).    Allergies  Allergen Reactions   Milk-Related Compounds Rash    Allergic to all dairy    Antimicrobials this admission: Clindamycin 900mg  IV q8h  9/8 >> Previously treated with Rocephin + Doxycycline    Thank you for allowing pharmacy to be a part of this patient's care.  06/24/2021 5:13 PM

## 2021-06-24 NOTE — ED Notes (Signed)
Called for triage and unable to find x1

## 2021-06-25 DIAGNOSIS — Z20822 Contact with and (suspected) exposure to covid-19: Secondary | ICD-10-CM | POA: Diagnosis present

## 2021-06-25 DIAGNOSIS — D500 Iron deficiency anemia secondary to blood loss (chronic): Secondary | ICD-10-CM | POA: Diagnosis present

## 2021-06-25 DIAGNOSIS — N730 Acute parametritis and pelvic cellulitis: Secondary | ICD-10-CM | POA: Diagnosis present

## 2021-06-25 DIAGNOSIS — N739 Female pelvic inflammatory disease, unspecified: Secondary | ICD-10-CM | POA: Diagnosis present

## 2021-06-25 MED ORDER — SODIUM CHLORIDE 0.9 % IV SOLN
510.0000 mg | Freq: Once | INTRAVENOUS | Status: AC
  Administered 2021-06-25: 510 mg via INTRAVENOUS
  Filled 2021-06-25: qty 17

## 2021-06-25 NOTE — Progress Notes (Signed)
S: No compaints. No pain today. Denies fever/chills. Feeling well.  O:  Vitals:   06/25/21 0757 06/25/21 1123  BP: (!) 115/57 (!) 106/59  Pulse: 70 (!) 58  Resp: 18 18  Temp: 98 F (36.7 C) 98.2 F (36.8 C)  SpO2: 100% 98%   Gen: NAD Pulm: NWOB CV: RR Abd: soft, nontender, nondistended  25 yo G1P1 HD#2 admitted for PID w/pyosalpinx unresponsive to outpatient PO abx.  # PID/pyosalpinx:  - IV gent/clinda - CBC reassuring w/out leukocytosis. - IR consultation prn. Given in fallopian tube, defer at this time.  - [place] in the AM  # Anemia- IV Fe planned given known anemia and low ferritin.    Rosie Fate MD

## 2021-06-26 ENCOUNTER — Inpatient Hospital Stay (HOSPITAL_COMMUNITY): Payer: Medicaid Other

## 2021-06-26 LAB — CBC WITH DIFFERENTIAL/PLATELET
Abs Immature Granulocytes: 0.01 10*3/uL (ref 0.00–0.07)
Basophils Absolute: 0 10*3/uL (ref 0.0–0.1)
Basophils Relative: 1 %
Eosinophils Absolute: 0 10*3/uL (ref 0.0–0.5)
Eosinophils Relative: 1 %
HCT: 27.9 % — ABNORMAL LOW (ref 36.0–46.0)
Hemoglobin: 9.4 g/dL — ABNORMAL LOW (ref 12.0–15.0)
Immature Granulocytes: 0 %
Lymphocytes Relative: 45 %
Lymphs Abs: 1.7 10*3/uL (ref 0.7–4.0)
MCH: 24.7 pg — ABNORMAL LOW (ref 26.0–34.0)
MCHC: 33.7 g/dL (ref 30.0–36.0)
MCV: 73.2 fL — ABNORMAL LOW (ref 80.0–100.0)
Monocytes Absolute: 0.5 10*3/uL (ref 0.1–1.0)
Monocytes Relative: 13 %
Neutro Abs: 1.6 10*3/uL — ABNORMAL LOW (ref 1.7–7.7)
Neutrophils Relative %: 40 %
Platelets: 186 10*3/uL (ref 150–400)
RBC: 3.81 MIL/uL — ABNORMAL LOW (ref 3.87–5.11)
RDW: 15 % (ref 11.5–15.5)
WBC: 3.9 10*3/uL — ABNORMAL LOW (ref 4.0–10.5)
nRBC: 0 % (ref 0.0–0.2)

## 2021-06-26 MED ORDER — AMOXICILLIN-POT CLAVULANATE 500-125 MG PO TABS: 1.0000 | ORAL_TABLET | Freq: Three times a day (TID) | ORAL | 0 refills | Status: AC

## 2021-06-26 MED ORDER — CLINDAMYCIN HCL 300 MG PO CAPS: 300.0000 mg | ORAL_CAPSULE | Freq: Three times a day (TID) | ORAL | 0 refills | Status: AC

## 2021-06-26 NOTE — Progress Notes (Signed)
Pt given discharge instructions. Pt verbalized understanding and all questions were answered. IV discontinued.

## 2021-06-26 NOTE — Discharge Summary (Signed)
Physician Discharge Summary  Patient ID: Ruth Mann MRN: [id number] DOB/AGE: 1995/12/13 25 y.o.  Admit date: 06/24/2021 Discharge date: 06/26/2021  Admission Diagnoses:  Discharge Diagnoses:  Active Problems:   PID (acute pelvic inflammatory disease)   Discharged Condition: good  Hospital Course: 25 yo with pyosalpinx presenting for IV antibiotics. She tested positive for chlamydia 05/27/21 and was treated as an outpatient. [place] at that time showed hydosalpinx. She had worsening pelvic pain and repeat US demonstrated pyosalpingitis.  She was afebrile and without WBC count. However, given worsening disease on [place], she was admitted. Hse received almost 48 hours of IV gent/clinda. [place] on HD#3 was read as normal. CBC reamained reassuring w/out leukocytosis.  She was chronically anemic and IV iron was given.  On day of discharge she remained AF and without pain. She is to complete a week long course of PO antibiotics.   Consults: None  Significant Diagnostic Studies: labs: CBC    Component Value Date/Time   WBC 3.9 (L) 06/26/2021 0517   RBC 3.81 (L) 06/26/2021 0517   HGB 9.4 (L) 06/26/2021 0517   HCT 27.9 (L) 06/26/2021 0517   PLT 186 06/26/2021 0517   MCV 73.2 (L) 06/26/2021 0517   MCH 24.7 (L) 06/26/2021 0517   MCHC 33.7 06/26/2021 0517   RDW 15.0 06/26/2021 0517   LYMPHSABS 1.7 06/26/2021 0517   MONOABS 0.5 06/26/2021 0517   EOSABS 0.0 06/26/2021 0517   BASOSABS 0.0 06/26/2021 0517    and radiology: Ultrasound: Normal  Treatments: antibiotics: gentamycin and clindamycin  Discharge Exam: Blood pressure 117/61, pulse 62, temperature 98.5 F (36.9 C), temperature source Oral, resp. rate 16, height 5\' 7"  (1.702 m), weight 57.2 kg, last menstrual period 05/31/2021, SpO2 99 %, not currently breastfeeding. General appearance: alert and cooperative Resp: NWOB Cardio: regular rate and rhythm, S1, S2 normal, no murmur, click, rub or gallop GI: soft, non-tender; bowel sounds  normal; no masses,  no organomegaly Pelvic: no adnexal masses or tenderness and no cervical motion tenderness Extremities: extremities normal, atraumatic, no cyanosis or edema Skin: Skin color, texture, turgor normal. No rashes or lesions  Disposition: Discharge disposition: 01-Home or Self Care       Discharge Instructions     Call MD for:  persistant nausea and vomiting   Complete by: As directed    Call MD for:  severe uncontrolled pain   Complete by: As directed    Call MD for:  temperature >100.4   Complete by: As directed    Diet general   Complete by: As directed    Driving Restrictions   Complete by: As directed    Do not drive until you are off narcotic pain medications and you feel like you can react in an emergency.   Increase activity slowly   Complete by: As directed       Allergies as of 06/26/2021       Reactions   Milk-related Compounds Rash   Allergic to all dairy        Medication List     STOP taking these medications    acetaminophen 500 MG tablet Commonly known as: TYLENOL   oxyCODONE 5 MG immediate release tablet Commonly known as: Oxy IR/ROXICODONE       TAKE these medications    amoxicillin-clavulanate 500-125 MG tablet Commonly known as: Augmentin Take 1 tablet (500 mg total) by mouth 3 (three) times daily for 7 days.   clindamycin 300 MG capsule Commonly known as: Cleocin Take 1 capsule (  300 mg total) by mouth 3 (three) times daily for 7 days.   hydrOXYzine 10 MG tablet Commonly known as: ATARAX/VISTARIL Take 1 tablet (10 mg total) by mouth 3 (three) times daily as needed.   ibuprofen 800 MG tablet Commonly known as: ADVIL Take 1 tablet (800 mg total) by mouth every 8 (eight) hours as needed.   prenatal multivitamin Tabs tablet Take 1 tablet by mouth daily at 12 noon.         Signed: Ranae Pila 06/26/2021, 7:52 AM

## 2021-06-26 NOTE — Progress Notes (Signed)
Pt to ultrasound

## 2021-07-10 ENCOUNTER — Emergency Department (HOSPITAL_COMMUNITY)
Admission: EM | Admit: 2021-07-10 | Discharge: 2021-07-10 | Disposition: A | Discharge status: Home / Self Care | Payer: Medicaid Other | Attending: Emergency Medicine | Admitting: Emergency Medicine

## 2021-07-10 ENCOUNTER — Emergency Department (HOSPITAL_COMMUNITY): Payer: Medicaid Other

## 2021-07-10 DIAGNOSIS — R102 Pelvic and perineal pain: Secondary | ICD-10-CM | POA: Diagnosis present

## 2021-07-10 DIAGNOSIS — Z8742 Personal history of other diseases of the female genital tract: Secondary | ICD-10-CM | POA: Diagnosis not present

## 2021-07-10 LAB — I-STAT BETA HCG BLOOD, ED (MC, WL, AP ONLY): I-stat hCG, quantitative: <5 m[IU]/mL

## 2021-07-10 LAB — COMPREHENSIVE METABOLIC PANEL
ALT: 22 U/L (ref 0–44)
AST: 29 U/L (ref 15–41)
Albumin: 3.7 g/dL (ref 3.5–5.0)
Alkaline Phosphatase: 47 U/L (ref 38–126)
Anion gap: 7 (ref 5–15)
BUN: 12 mg/dL (ref 6–20)
CO2: 24 mmol/L (ref 22–32)
Calcium: 9.4 mg/dL (ref 8.9–10.3)
Chloride: 106 mmol/L (ref 98–111)
Creatinine, Ser: 0.71 mg/dL (ref 0.44–1.00)
GFR, Estimated: >60 mL/min (ref >60)
Glucose, Bld: 96 mg/dL (ref 70–99)
Potassium: 4.6 mmol/L (ref 3.5–5.1)
Sodium: 137 mmol/L (ref 135–145)
Total Bilirubin: 0.9 mg/dL (ref 0.3–1.2)
Total Protein: 7.3 g/dL (ref 6.5–8.1)

## 2021-07-10 LAB — CBC WITH DIFFERENTIAL/PLATELET
Abs Immature Granulocytes: 0.01 10*3/uL (ref 0.00–0.07)
Basophils Absolute: 0 10*3/uL (ref 0.0–0.1)
Basophils Relative: 1 %
Eosinophils Absolute: 0 10*3/uL (ref 0.0–0.5)
Eosinophils Relative: 1 %
HCT: 34.5 % — ABNORMAL LOW (ref 36.0–46.0)
Hemoglobin: 11.4 g/dL — ABNORMAL LOW (ref 12.0–15.0)
Immature Granulocytes: 0 %
Lymphocytes Relative: 43 %
Lymphs Abs: 1.2 10*3/uL (ref 0.7–4.0)
MCH: 25.2 pg — ABNORMAL LOW (ref 26.0–34.0)
MCHC: 33 g/dL (ref 30.0–36.0)
MCV: 76.3 fL — ABNORMAL LOW (ref 80.0–100.0)
Monocytes Absolute: 0.3 10*3/uL (ref 0.1–1.0)
Monocytes Relative: 10 %
Neutro Abs: 1.2 10*3/uL — ABNORMAL LOW (ref 1.7–7.7)
Neutrophils Relative %: 45 %
Platelets: 316 10*3/uL (ref 150–400)
RBC: 4.52 MIL/uL (ref 3.87–5.11)
RDW: 18.3 % — ABNORMAL HIGH (ref 11.5–15.5)
WBC: 2.7 10*3/uL — ABNORMAL LOW (ref 4.0–10.5)
nRBC: 0 % (ref 0.0–0.2)

## 2021-07-10 LAB — URINALYSIS, ROUTINE W REFLEX MICROSCOPIC
Bilirubin Urine: NEGATIVE
Glucose, UA: NEGATIVE mg/dL
Hgb urine dipstick: NEGATIVE
Ketones, ur: NEGATIVE mg/dL
Leukocytes,Ua: NEGATIVE
Nitrite: NEGATIVE
Protein, ur: NEGATIVE mg/dL
Specific Gravity, Urine: 1.021 (ref 1.005–1.030)
pH: 6 (ref 5.0–8.0)

## 2021-07-10 MED ORDER — AMOXICILLIN-POT CLAVULANATE 500-125 MG PO TABS: 1.0000 | ORAL_TABLET | Freq: Three times a day (TID) | ORAL | 0 refills | Status: AC

## 2021-07-10 MED ORDER — CLINDAMYCIN HCL 300 MG PO CAPS: 300.0000 mg | ORAL_CAPSULE | Freq: Three times a day (TID) | ORAL | 0 refills | Status: AC

## 2021-07-10 NOTE — ED Provider Notes (Signed)
[facility] EMERGENCY DEPARTMENT Provider Note   CSN: [id number] Arrival date & time: 07/10/21  1058     History No chief complaint on file.   Ruth Mann is a 25 y.o. female.  Patient was recently admitted and discharged patient was admitted September 8 through September 10 2 OB/GYN service for outpatient failure of acute pelvic inflammatory disease.  Patient was discharged with a 7-day course of clindamycin and Augmentin.  Patient's state she did feel better.  And then a few days after stopping the antibiotics she started to get pain again.  She has not followed up with OB/GYN.  But apparently has an appointment to follow-up with them next week.  Patient had inpatient treatment with IV gentamicin and clindamycin.  She had an ultrasound at the time that showed hydrosalpinx.  Patient's ultrasound on hospital day 3 was read as normal and CBC remained reassuring without leukocytosis.  Patient has a history of chronic anemia and was given IV iron during that hospitalization.      Past Medical History:  Diagnosis Date   Pregnancy induced hypertension     Patient Active Problem List   Diagnosis Date Noted   PID (acute pelvic inflammatory disease) 06/24/2021   Pregnant 12/25/2019    Past Surgical History:  Procedure Laterality Date   CESAREAN SECTION N/A 12/25/2019   Procedure: CESAREAN SECTION;  Surgeon: Ranae Pila, MD;  Location: [facility] LD ORS;  Service: Obstetrics;  Laterality: N/A;   WISDOM TOOTH EXTRACTION       OB History     Gravida  1   Para  1   Term  1   Preterm      AB      Living  1      SAB      IAB      Ectopic      Multiple  0   Live Births  1           No family history on file.  Social History   Tobacco Use   Smoking status: Never   Smokeless tobacco: Never  Vaping Use   Vaping Use: Never used  Substance Use Topics   Alcohol use: Not Currently    Comment: socially   Drug use: No    Home  Medications Prior to Admission medications   Medication Sig Start Date End Date Taking? Authorizing Provider  hydrOXYzine (ATARAX/VISTARIL) 10 MG tablet Take 1 tablet (10 mg total) by mouth 3 (three) times daily as needed. 06/15/21   Park Pope, MD  ibuprofen (ADVIL) 800 MG tablet Take 1 tablet (800 mg total) by mouth every 8 (eight) hours as needed. 12/27/19   Harold Hedge, MD  Prenatal Vit-Fe Fumarate-FA (PRENATAL MULTIVITAMIN) TABS tablet Take 1 tablet by mouth daily at 12 noon.    [provider]    Allergies    Milk-related compounds  Review of Systems   Review of Systems  Constitutional:  Negative for chills and fever.  HENT:  Negative for ear pain and sore throat.   Eyes:  Negative for pain and visual disturbance.  Respiratory:  Negative for cough and shortness of breath.   Cardiovascular:  Negative for chest pain and palpitations.  Gastrointestinal:  Negative for abdominal pain and vomiting.  Genitourinary:  Positive for pelvic pain. Negative for dysuria and hematuria.  Musculoskeletal:  Negative for arthralgias and back pain.  Skin:  Negative for color change and rash.  Neurological:  Negative for seizures and  syncope.  All other systems reviewed and are negative.  Physical Exam Updated Vital Signs BP 131/79 (BP Location: Left Arm)   Pulse 71   Temp 98.4 F (36.9 C) (Oral)   Resp 16   Ht 1.702 m (5\' 7" )   Wt 57.2 kg   LMP 07/01/2021   SpO2 100%   BMI 19.73 kg/m   Physical Exam Vitals and nursing note reviewed.  Constitutional:      General: She is not in acute distress.    Appearance: Normal appearance. She is well-developed.  HENT:     Head: Normocephalic and atraumatic.  Eyes:     Extraocular Movements: Extraocular movements intact.     Conjunctiva/sclera: Conjunctivae normal.     Pupils: Pupils are equal, round, and reactive to light.  Cardiovascular:     Rate and Rhythm: Normal rate and regular rhythm.     Heart sounds: No murmur  heard. Pulmonary:     Effort: Pulmonary effort is normal. No respiratory distress.     Breath sounds: Normal breath sounds.  Abdominal:     Palpations: Abdomen is soft.     Tenderness: There is no abdominal tenderness.  Musculoskeletal:     Cervical back: Neck supple.  Skin:    General: Skin is warm and dry.  Neurological:     General: No focal deficit present.     Mental Status: She is alert and oriented to person, place, and time.    ED Results / Procedures / Treatments   Labs (all labs ordered are listed, but only abnormal results are displayed) Labs Reviewed  CBC WITH DIFFERENTIAL/PLATELET - Abnormal; Notable for the following components:      Result Value   WBC 2.7 (*)    Hemoglobin 11.4 (*)    HCT 34.5 (*)    MCV 76.3 (*)    MCH 25.2 (*)    RDW 18.3 (*)    Neutro Abs 1.2 (*)    All other components within normal limits  COMPREHENSIVE METABOLIC PANEL  URINALYSIS, ROUTINE W REFLEX MICROSCOPIC  I-STAT BETA HCG BLOOD, ED ([facility], [facility], [facility] ONLY)    EKG None  Radiology 07/03/2021 PELVIC COMPLETE W TRANSVAGINAL AND TORSION R/O  Result Date: 07/10/2021 CLINICAL DATA:  Pelvic pain EXAM: TRANSABDOMINAL AND TRANSVAGINAL ULTRASOUND OF PELVIS DOPPLER ULTRASOUND OF OVARIES TECHNIQUE: Both transabdominal and transvaginal ultrasound examinations of the pelvis were performed. Transabdominal technique was performed for global imaging of the pelvis including uterus, ovaries, adnexal regions, and pelvic cul-de-sac. It was necessary to proceed with endovaginal exam following the transabdominal exam to visualize the uterus, endometrium, ovaries, and adnexa. Color and duplex Doppler ultrasound was utilized to evaluate blood flow to the ovaries. COMPARISON:  06/26/2021 FINDINGS: Uterus Measurements: 8.2 x 4.0 x 4.7 cm = volume: 81 mL. No fibroids or other mass visualized. Endometrium Thickness: 10 mm.  No focal abnormality visualized. Right ovary Measurements: 3.1 x 2.1 x 2.7 cm = volume: 9 mL. Normal  appearance/no adnexal mass. Multiple small follicles. Left ovary Measurements: 3.8 x 2.0 x 2.9 cm = volume: 11 mL. Normal appearance/no adnexal mass. Multiple small follicles. Pulsed Doppler evaluation of both ovaries demonstrates normal low-resistance arterial and venous waveforms. Other findings Moderate volume nonspecific free fluid in the pelvis. IMPRESSION: 1. No ultrasound abnormality of the pelvis to explain pelvic pain. Consider CT or MRI to further evaluate otherwise unexplained pain. 2. Moderate volume nonspecific free fluid in the pelvis. Electronically Signed   By: 08/26/2021 M.D.   On:  07/10/2021 13:07    Procedures Procedures   Medications Ordered in ED Medications - No data to display  ED Course  I have reviewed the triage vital signs and the nursing notes.  Pertinent labs & imaging results that were available during my care of the patient were reviewed by me and considered in my medical decision making (see chart for details).    MDM Rules/Calculators/A&P                           Patient had the game plan discussed with her.  Unfortunately she had a long wait even prior to getting back here.  Patient does not want to undergo the pelvic was planning to do pelvic and reculture.  Her ultrasound done today does not show any recurrence of infection.  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  Patient is in agreement with restarting antibiotics.  States she will follow-up with her OB/GYN.  Otherwise she appears nontoxic.  Urinalysis was a little hazy not distinctly a consistent with a urinary tract infection.  Pregnancy test was negative.  No leukocytosis.  Not febrile.  Temperature was 98.4.  Would prefer patient to have complete physical exam to include pelvic but she does not want a wait any longer and she wants to leave.  At this point I do not think we need to sign her out AMA.  We will just retreat her with restarting the antibiotics.  And then OB/GYN can take it from  there.   Final Clinical Impression(s) / ED Diagnoses Final diagnoses:  Pelvic pain    Rx / DC Orders ED Discharge Orders     None        Vanetta Mulders, MD 07/10/21 [phone]

## 2021-07-10 NOTE — ED Provider Notes (Signed)
Emergency Medicine Provider Triage Evaluation Note  Ruth Mann , a 25 y.o. female  was evaluated in triage.  Pt complains of right-sided pelvic pain.  She has had intermittent pelvic pain and back pain for the past year after she underwent a C-section.  A few weeks ago she had similar pain on the left side and was diagnosed with a pyosalpinx.  She was admitted for 2 days for IV antibiotics and discharged with a week worth of p.o. antibiotics which she took.  She initially stopped taking the antibiotics and completed the course her pain returned now on the right side.  This feels similar to when she was diagnosed with a pyosalpinx.  Denying any vomiting, diarrhea or urinary symptoms.  Review of Systems  Positive: Pelvic pain, vaginal discharge Negative: Vomiting, diarrhea  Physical Exam  BP 126/70 (BP Location: Left Arm)   Pulse 94   Temp 98.4 F (36.9 C) (Oral)   Resp 16   LMP 07/01/2021   SpO2 100%  Gen:   Awake, no distress   Resp:  Normal effort  MSK:   Moves extremities without difficulty  Other:  Lower abdominal tenderness without rebound or guarding  Medical Decision Making  Medically screening exam initiated at 11:28 AM.  Appropriate orders placed.  Ruth Mann was informed that the remainder of the evaluation will be completed by another provider, this initial triage assessment does not replace that evaluation, and the importance of remaining in the ED until their evaluation is complete.  Lab work and ultrasound ordered based on history   [name], Cordelia Poche 07/10/21 1129    Vanetta Mulders, MD 07/19/21 1308

## 2021-07-10 NOTE — Discharge Instructions (Signed)
Follow-up with OB/GYN.  Restart the antibiotics in the meantime.  Return for any new or worse symptoms.

## 2021-07-10 NOTE — ED Notes (Signed)
Pt ambulated to restroom independently.

## 2021-07-10 NOTE — ED Triage Notes (Signed)
Pt reports intermittent pelvic pain x 1 year.  Also reports dysuria for a few days.

## 2022-08-26 ENCOUNTER — Emergency Department (HOSPITAL_COMMUNITY)
Admission: EM | Admit: 2022-08-26 | Discharge: 2022-08-27 | Discharge status: Against Medical Advice | Payer: Medicaid Other | Attending: Emergency Medicine | Admitting: Emergency Medicine

## 2022-08-26 ENCOUNTER — Other Ambulatory Visit: Payer: Self-pay

## 2022-08-26 ENCOUNTER — Encounter (HOSPITAL_COMMUNITY): Payer: Self-pay

## 2022-08-26 DIAGNOSIS — R102 Pelvic and perineal pain: Secondary | ICD-10-CM | POA: Insufficient documentation

## 2022-08-26 DIAGNOSIS — R079 Chest pain, unspecified: Secondary | ICD-10-CM | POA: Insufficient documentation

## 2022-08-26 DIAGNOSIS — Z9889 Other specified postprocedural states: Secondary | ICD-10-CM | POA: Insufficient documentation

## 2022-08-26 DIAGNOSIS — M549 Dorsalgia, unspecified: Secondary | ICD-10-CM | POA: Diagnosis not present

## 2022-08-26 DIAGNOSIS — Z5321 Procedure and treatment not carried out due to patient leaving prior to being seen by health care provider: Secondary | ICD-10-CM | POA: Insufficient documentation

## 2022-08-26 DIAGNOSIS — R0602 Shortness of breath: Secondary | ICD-10-CM | POA: Insufficient documentation

## 2022-08-26 HISTORY — DX: Endometriosis, unspecified: N80.9

## 2022-08-26 LAB — CBC
HCT: 33.3 % — ABNORMAL LOW (ref 36.0–46.0)
Hemoglobin: 11.7 g/dL — ABNORMAL LOW (ref 12.0–15.0)
MCH: 27.7 pg (ref 26.0–34.0)
MCHC: 35.1 g/dL (ref 30.0–36.0)
MCV: 78.7 fL — ABNORMAL LOW (ref 80.0–100.0)
Platelets: 226 10*3/uL (ref 150–400)
RBC: 4.23 MIL/uL (ref 3.87–5.11)
RDW: 13.6 % (ref 11.5–15.5)
WBC: 5.2 10*3/uL (ref 4.0–10.5)
nRBC: 0 % (ref 0.0–0.2)

## 2022-08-26 LAB — URINALYSIS, ROUTINE W REFLEX MICROSCOPIC
Bilirubin Urine: NEGATIVE
Glucose, UA: NEGATIVE mg/dL
Hgb urine dipstick: NEGATIVE
Ketones, ur: NEGATIVE mg/dL
Leukocytes,Ua: NEGATIVE
Nitrite: NEGATIVE
Protein, ur: NEGATIVE mg/dL
Specific Gravity, Urine: 1.018 (ref 1.005–1.030)
pH: 6 (ref 5.0–8.0)

## 2022-08-26 LAB — COMPREHENSIVE METABOLIC PANEL
ALT: 11 U/L (ref 0–44)
AST: 18 U/L (ref 15–41)
Albumin: 3.7 g/dL (ref 3.5–5.0)
Alkaline Phosphatase: 41 U/L (ref 38–126)
Anion gap: 8 (ref 5–15)
BUN: 10 mg/dL (ref 6–20)
CO2: 23 mmol/L (ref 22–32)
Calcium: 9.4 mg/dL (ref 8.9–10.3)
Chloride: 106 mmol/L (ref 98–111)
Creatinine, Ser: 0.77 mg/dL (ref 0.44–1.00)
GFR, Estimated: >60 mL/min (ref >60)
Glucose, Bld: 98 mg/dL (ref 70–99)
Potassium: 4 mmol/L (ref 3.5–5.1)
Sodium: 137 mmol/L (ref 135–145)
Total Bilirubin: 0.2 mg/dL — ABNORMAL LOW (ref 0.3–1.2)
Total Protein: 6.9 g/dL (ref 6.5–8.1)

## 2022-08-26 NOTE — ED Triage Notes (Signed)
Pt reports she had a laparoscopy for endometriosis on 10/18 and now reports SOB, pain with inspiration, back pain, pelvic pain & chest pain; onset after the surgery but reports the pain worsened last week.

## 2022-08-26 NOTE — ED Provider Triage Note (Signed)
Emergency Medicine Provider Triage Evaluation Note  Ruth Mann , a 26 y.o. female  was evaluated in triage.  Pt complains of shortness of breath, chest pain, back pain, and pelvic pain. Had surgery for endometriosis on 10/18, had some of these symptoms since but they got worse in the past week. Called her surgeon and they were concerned about a blood clot and sent her to the ER.   Review of Systems  Positive: As above, constipation Negative: Cough, N/V, urinary sx  Physical Exam  BP 127/73   Pulse 82   Temp 98.9 F (37.2 C) (Oral)   Resp 16   Ht 5\' 7"  (1.702 m)   Wt 60.3 kg   LMP 07/21/2022 (Approximate)   SpO2 100%   BMI 20.83 kg/m  Gen:   Awake, no distress   Resp:  Normal effort  MSK:   Moves extremities without difficulty  Other:    Medical Decision Making  Medically screening exam initiated at 6:43 PM.  Appropriate orders placed.  Ruth Mann was informed that the remainder of the evaluation will be completed by another provider, this initial triage assessment does not replace that evaluation, and the importance of remaining in the ED until their evaluation is complete.     [name], PA-C 08/26/22 1851

## 2022-08-27 LAB — I-STAT BETA HCG BLOOD, ED (MC, WL, AP ONLY): I-stat hCG, quantitative: <5 m[IU]/mL (ref <5)

## 2022-10-13 IMAGING — US US TRANSVAGINAL NON-OB
1 series · 15 of 25 positions shown · non-contrast
Comparison: None.

CLINICAL DATA: 25-year-old female "[place]" on antibiotics.
By report of prior ultrasound showed bilateral hydrosalpinx.

EXAM:
ULTRASOUND PELVIS TRANSVAGINAL
TECHNIQUE: Transvaginal ultrasound examination of the pelvis was performed
including evaluation of the uterus, ovaries, adnexal regions, and
pelvic cul-de-sac.

[Series 1: us transvaginal non-ob · 15 of 40 slices shown]
[im 1/40]
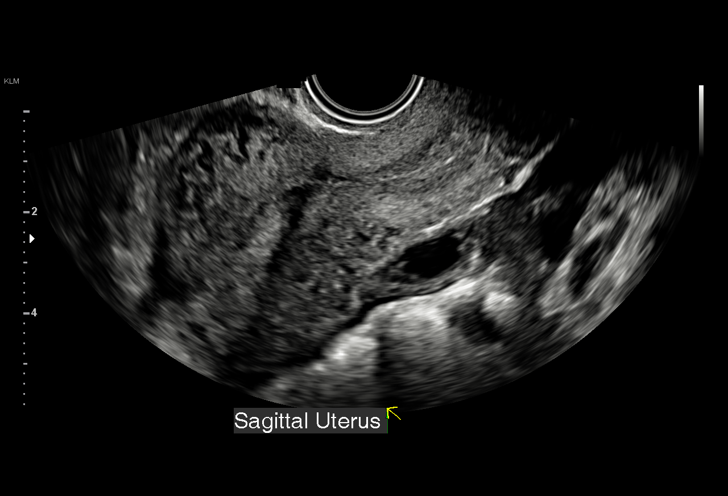
[im 4/40]
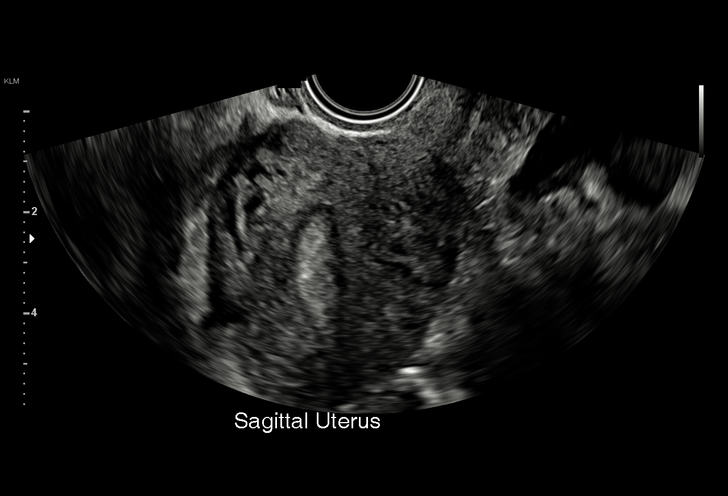
[im 7/40]
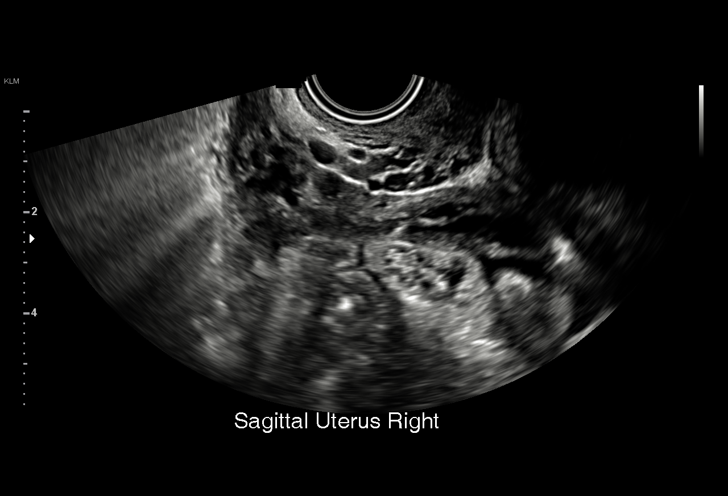
[im 9/40]
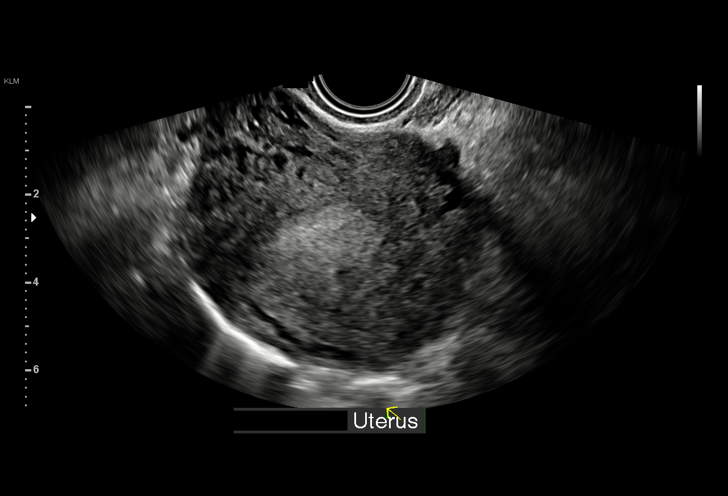
[im 12/40]
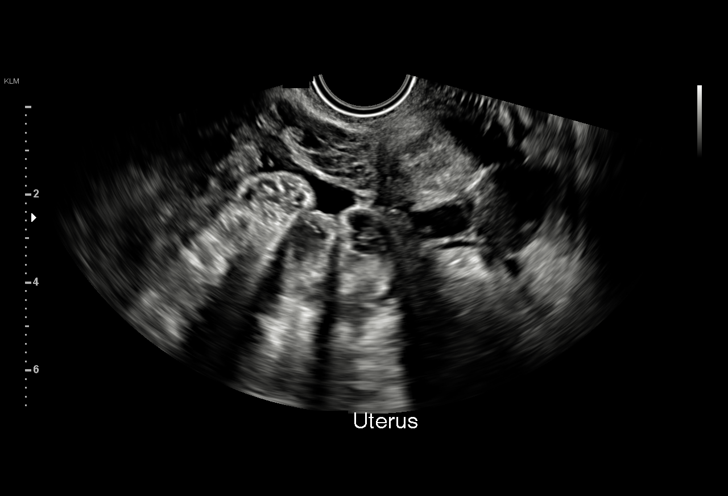
[im 15/40]
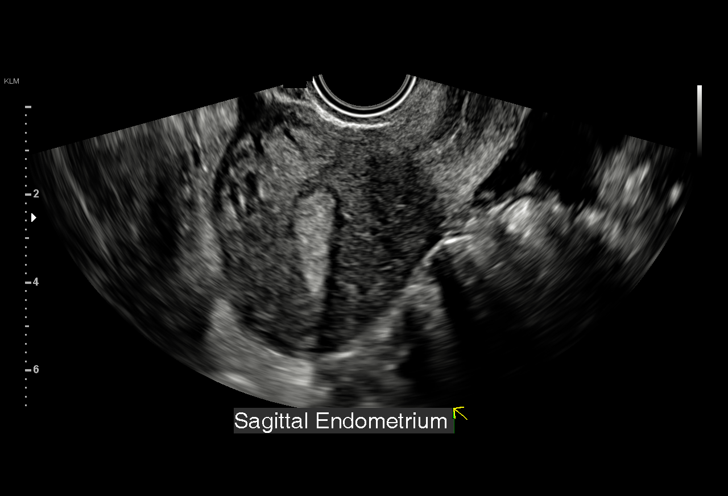
[im 17/40]
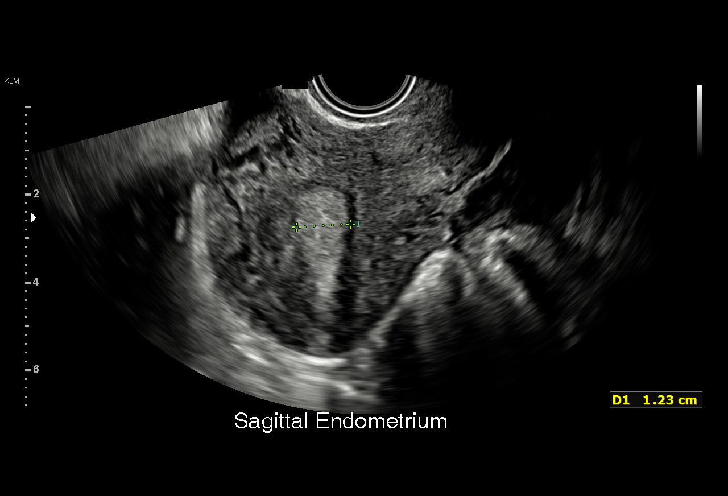
[im 20/40]
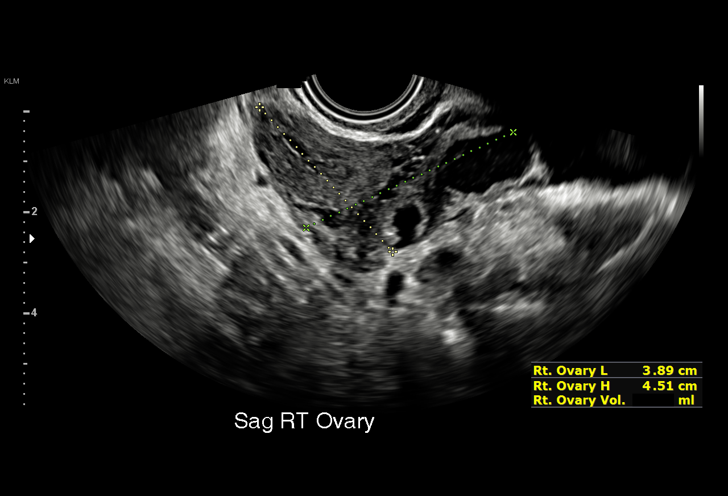
[im 23/40]
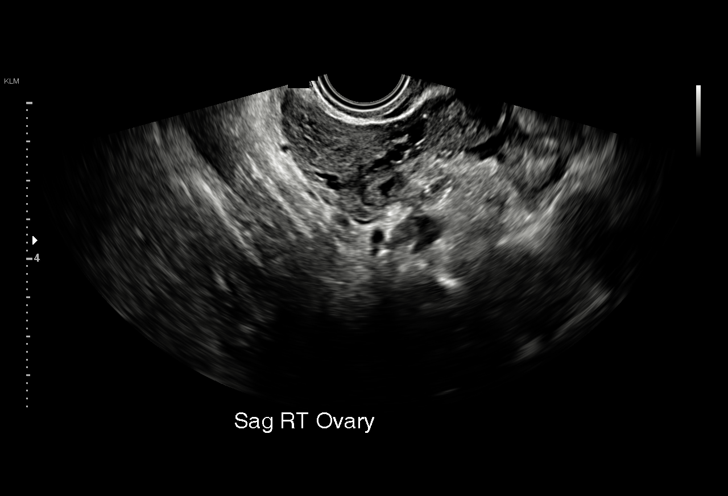
[im 25/40]
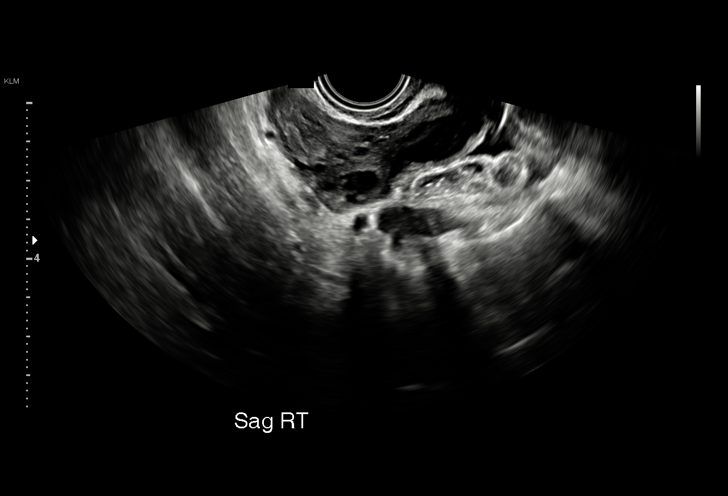
[im 28/40]
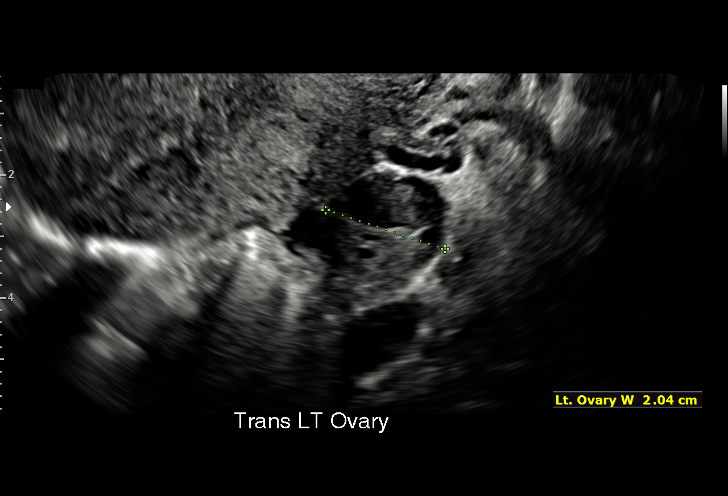
[im 31/40]
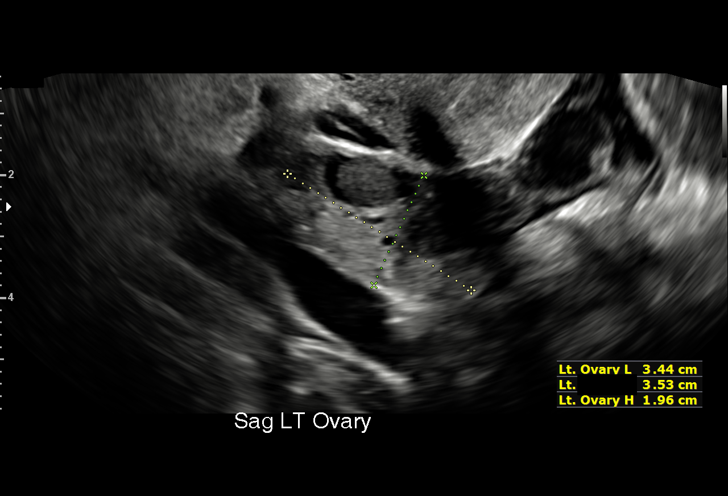
[im 33/40]
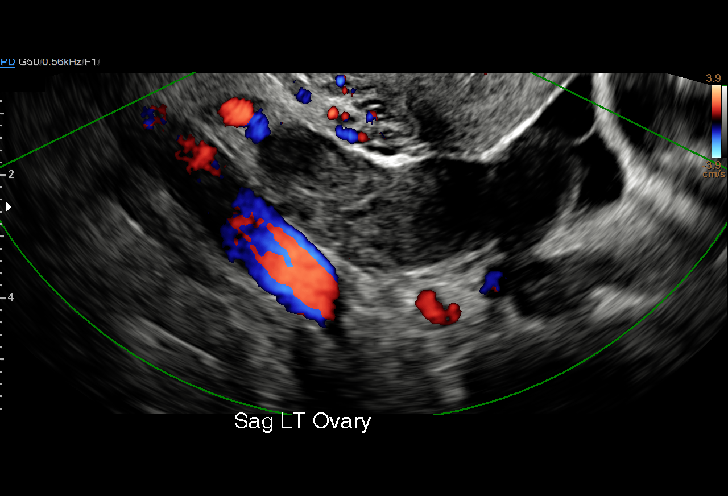
[im 36/40]
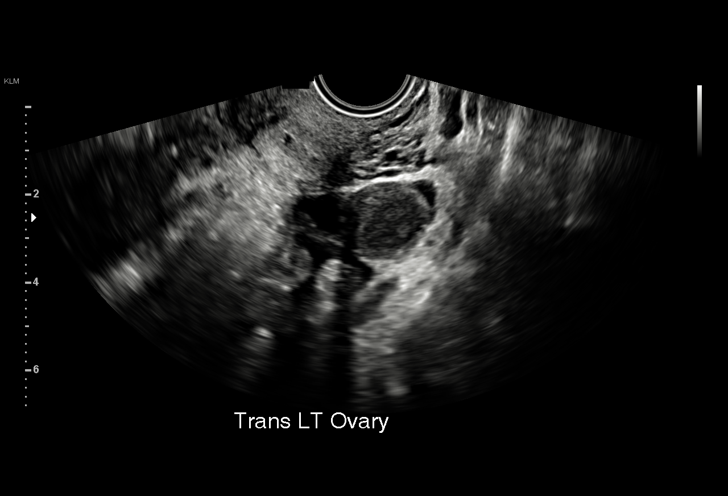
[im 40/40]
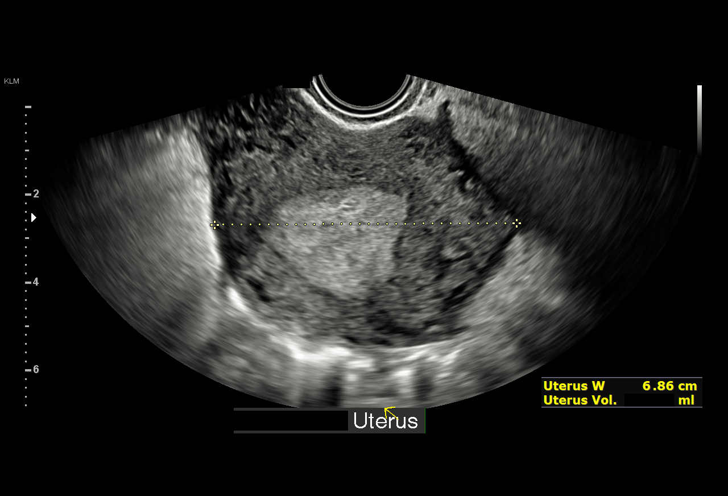

[15 of 25 positions shown; findings below may reference images not displayed]

FINDINGS: Uterus

Measurements: 8.4 x 4.8 x 6.9 cm = volume: 144 mL. No fibroids or
other mass visualized.

Endometrium

Thickness: 8 mm.  No focal abnormality visualized.

Right ovary

Measurements: 3.9 x 4.5 x 3.7 cm = volume: 34 mL. Multiple anechoic
areas mostly resemble small cysts or follicles (images 21 and 22).
Maintained ovarian vascularity on color Doppler. No suspicious right
ovarian lesion is identified. No right hydrosalpinx is evident.

Left ovary

Measurements: 3.5 x 2.0 x 2.0 cm = volume: 7 mL. Normal
appearance/no adnexal mass. No left hydrosalpinx is evident.

Other findings: There is a small volume of free fluid in the
cul-de-sac which appears fairly simple (image 6).
IMPRESSION: Largely normal ultrasound appearance of the pelvis;
Small volume of free fluid in the cul-de-sac and asymmetrically
larger right ovary but no hydrosalpinx or suspicious ovarian lesion
is evident.

## 2022-10-27 IMAGING — US US PELVIS COMPLETE TRANSABD/TRANSVAG W DUPLEX
1 series · 13 of 25 positions shown · non-contrast
Comparison: 06/26/2021

CLINICAL DATA: Pelvic pain

EXAM:
TRANSABDOMINAL AND TRANSVAGINAL ULTRASOUND OF PELVIS
DOPPLER ULTRASOUND OF OVARIES
TECHNIQUE: Both transabdominal and transvaginal ultrasound examinations of the
pelvis were performed. Transabdominal technique was performed for
global imaging of the pelvis including uterus, ovaries, adnexal
regions, and pelvic cul-de-sac.
It was necessary to proceed with endovaginal exam following the
transabdominal exam to visualize the uterus, endometrium, ovaries,
and adnexa. Color and duplex Doppler ultrasound was utilized to
evaluate blood flow to the ovaries.

[Series 1: us pelvic complete w transvaginal and torsion righ · 13 of 104 slices shown]
[im 1/104]
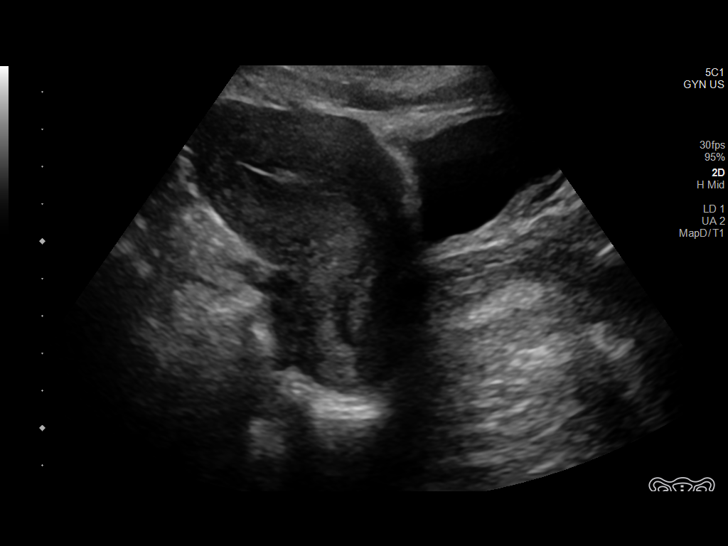
[im 9/104]
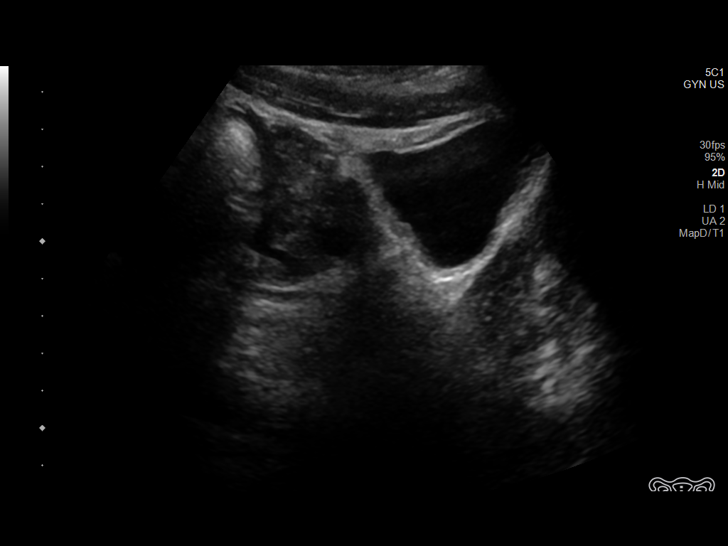
[im 18/104]
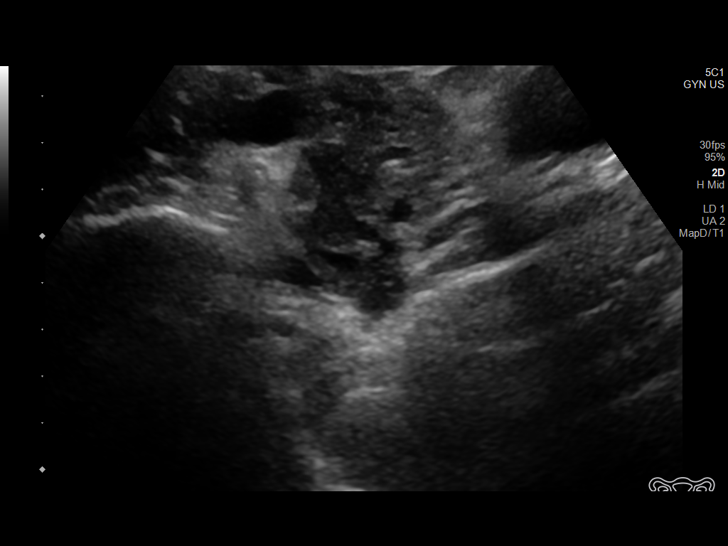
[im 26/104]
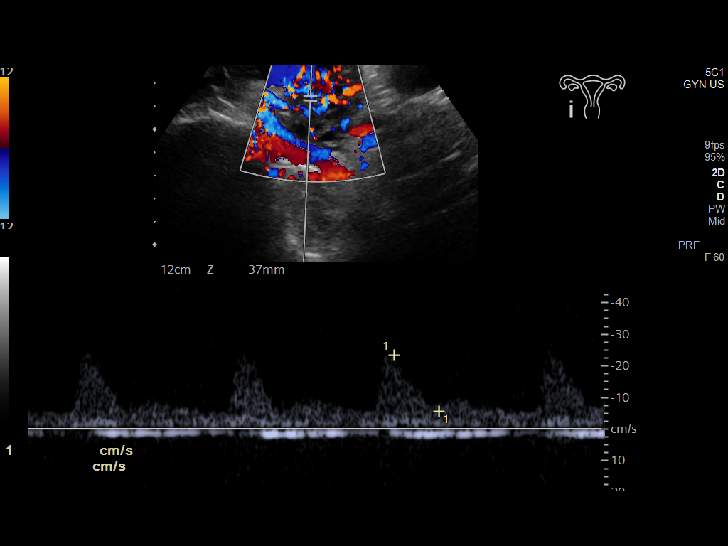
[im 35/104]
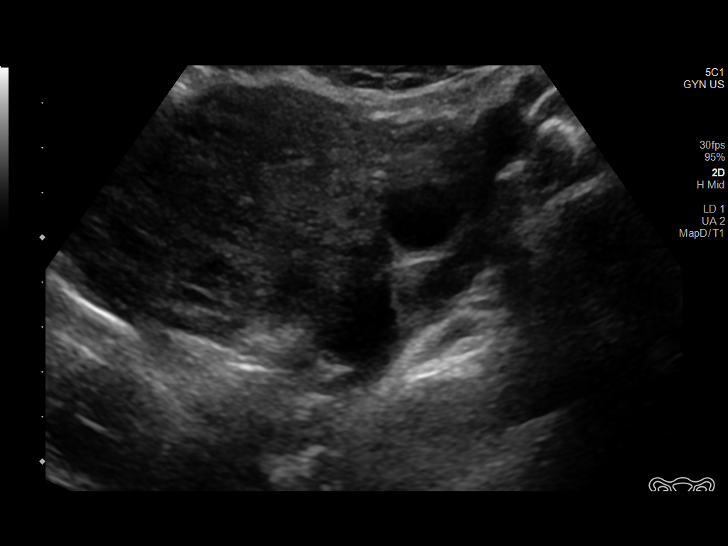
[im 43/104]
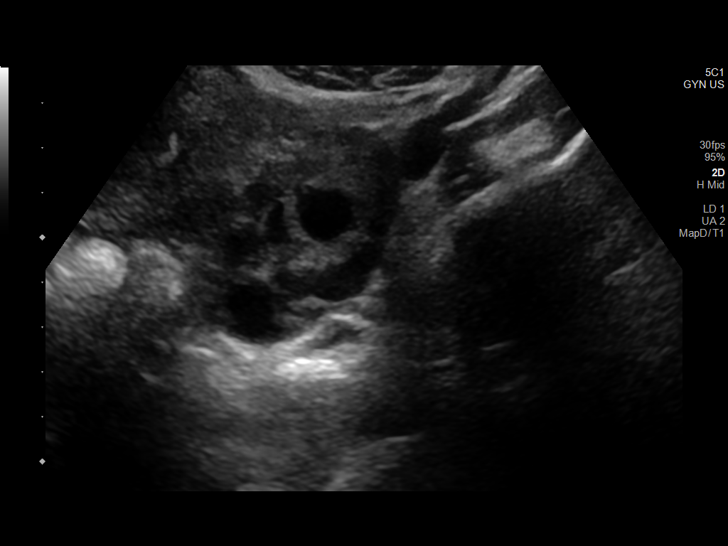
[im 52/104]
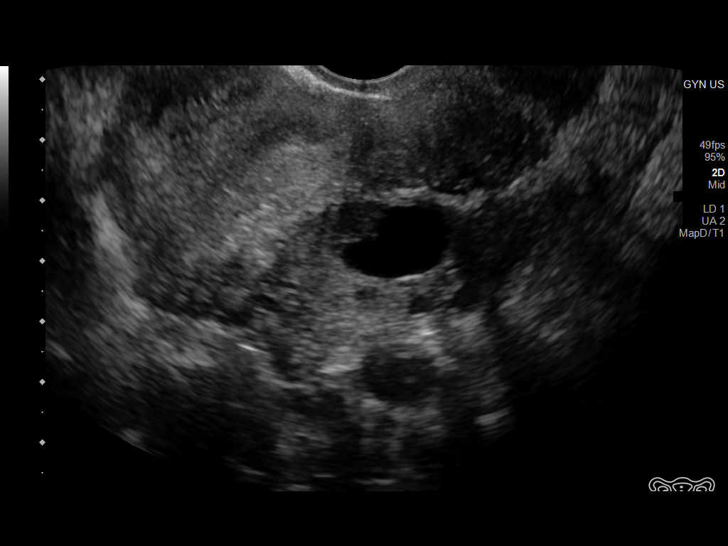
[im 61/104]
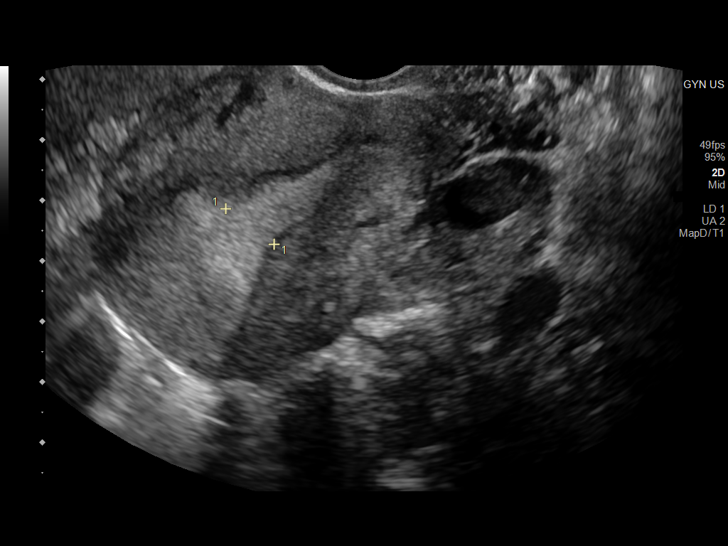
[im 69/104]
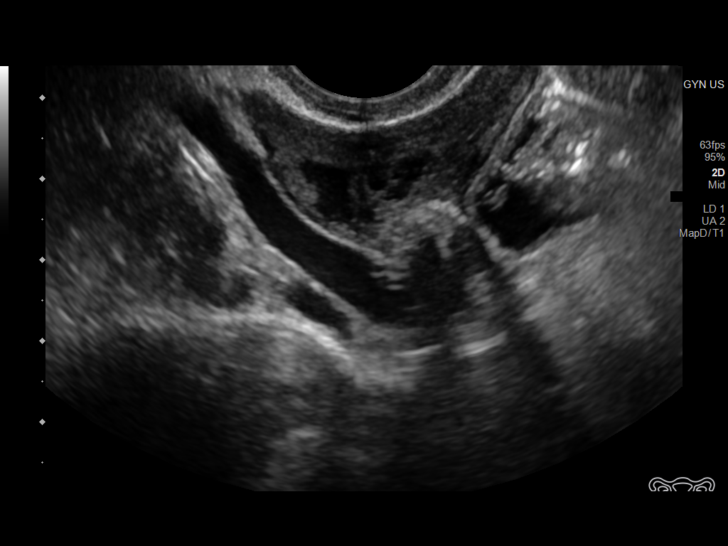
[im 78/104]
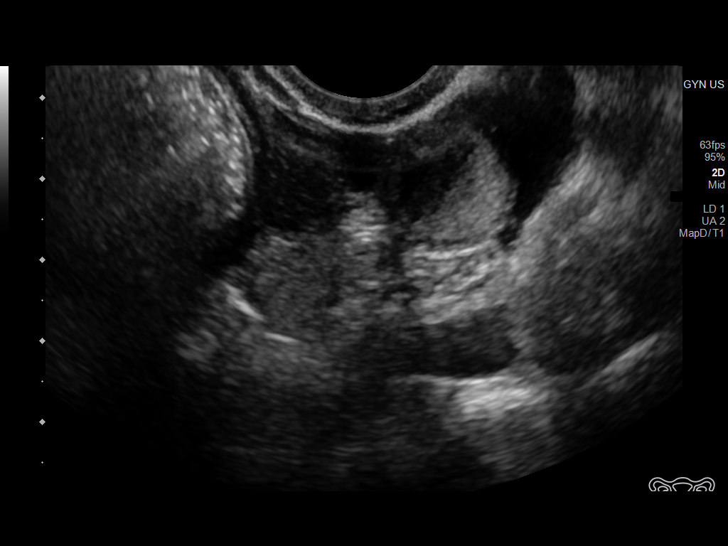
[im 86/104]
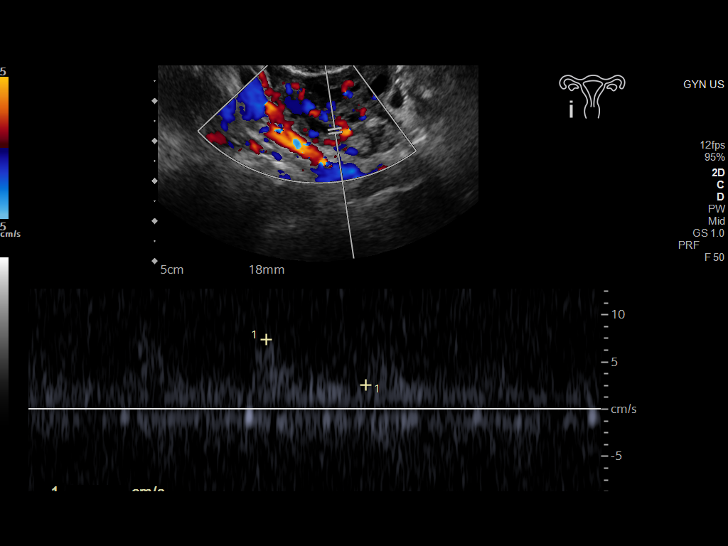
[im 95/104]
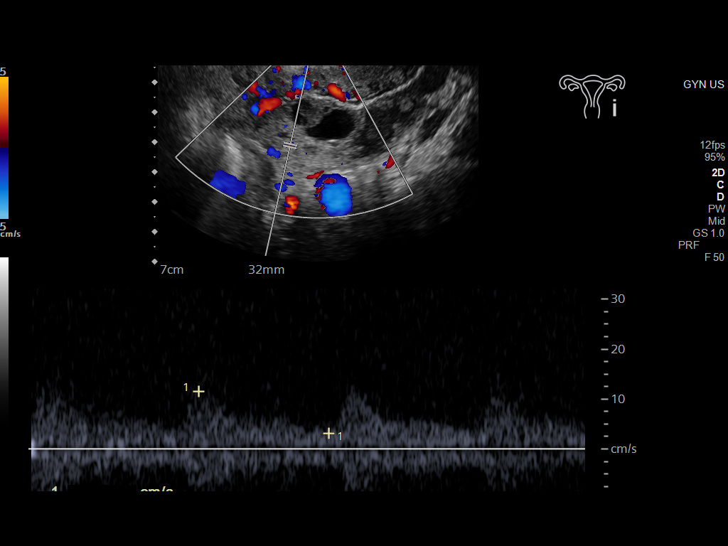
[im 104/104]
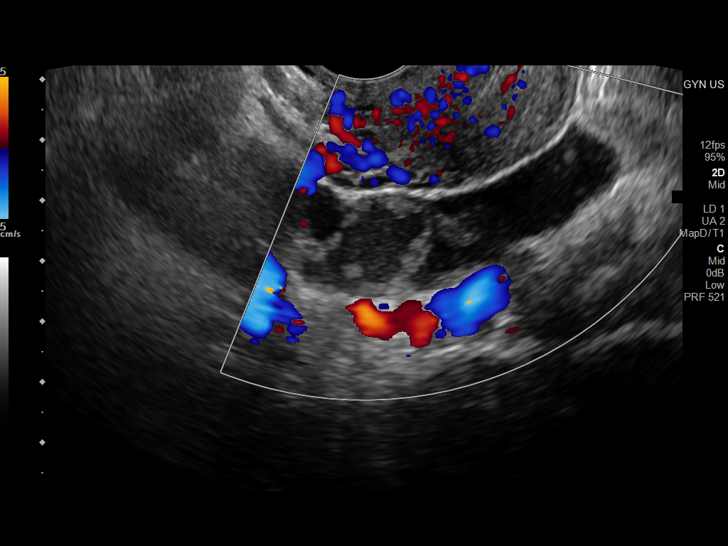

[13 of 25 positions shown; findings below may reference images not displayed]

FINDINGS: Uterus

Measurements: 8.2 x 4.0 x 4.7 cm = volume: 81 mL. No fibroids or
other mass visualized.

Endometrium

Thickness: 10 mm.  No focal abnormality visualized.

Right ovary

Measurements: 3.1 x 2.1 x 2.7 cm = volume: 9 mL. Normal
appearance/no adnexal mass. Multiple small follicles.

Left ovary

Measurements: 3.8 x 2.0 x 2.9 cm = volume: 11 mL. Normal
appearance/no adnexal mass. Multiple small follicles.

Pulsed Doppler evaluation of both ovaries demonstrates normal
low-resistance arterial and venous waveforms.

Other findings

Moderate volume nonspecific free fluid in the pelvis.
IMPRESSION: 1. No ultrasound abnormality of the pelvis to explain pelvic pain.
Consider CT or MRI to further evaluate otherwise unexplained pain.
2. Moderate volume nonspecific free fluid in the pelvis.
